# Patient Record
Sex: Male | Born: 1965 | Race: Asian | Hispanic: No | Marital: Married | Smoking: Never smoker
Health system: Southern US, Community
[De-identification: ages and names within clinical notes are randomized; demographics above are authoritative.]

## PROBLEM LIST (undated history)

## (undated) DIAGNOSIS — I10 Essential (primary) hypertension: Secondary | ICD-10-CM

## (undated) HISTORY — DX: Essential (primary) hypertension: I10

---

## 2013-07-19 ENCOUNTER — Ambulatory Visit (INDEPENDENT_AMBULATORY_CARE_PROVIDER_SITE_OTHER): Payer: BC Managed Care – PPO | Admitting: Family Medicine

## 2013-07-19 ENCOUNTER — Ambulatory Visit (INDEPENDENT_AMBULATORY_CARE_PROVIDER_SITE_OTHER): Payer: BC Managed Care – PPO | Admitting: Internal Medicine

## 2013-07-19 ENCOUNTER — Encounter: Payer: Self-pay | Admitting: Internal Medicine

## 2013-07-19 VITALS — BP 132/72 | HR 72 | Ht 65.0 in | Wt 142.0 lb

## 2013-07-19 VITALS — BP 126/86 | HR 65 | Temp 98.5°F | Resp 18 | Ht 65.0 in | Wt 143.0 lb

## 2013-07-19 DIAGNOSIS — R002 Palpitations: Secondary | ICD-10-CM

## 2013-07-19 DIAGNOSIS — R0602 Shortness of breath: Secondary | ICD-10-CM

## 2013-07-19 LAB — COMPREHENSIVE METABOLIC PANEL
ALT: 14 U/L (ref 0–53)
AST: 17 U/L (ref 0–37)
Albumin: 4.4 g/dL (ref 3.5–5.2)
Alkaline Phosphatase: 64 U/L (ref 39–117)
BUN: 14 mg/dL (ref 6–23)
CO2: 28 mEq/L (ref 19–32)
Calcium: 9.5 mg/dL (ref 8.4–10.5)
Chloride: 105 mEq/L (ref 96–112)
Creat: 1.03 mg/dL (ref 0.50–1.35)
Glucose, Bld: 102 mg/dL — ABNORMAL HIGH (ref 70–99)
Potassium: 4.5 mEq/L (ref 3.5–5.3)
Sodium: 141 mEq/L (ref 135–145)
Total Bilirubin: 0.9 mg/dL (ref 0.3–1.2)
Total Protein: 7 g/dL (ref 6.0–8.3)

## 2013-07-19 LAB — POCT CBC
Granulocyte percent: 51.3 %G (ref 37–80)
HCT, POC: 46.9 % (ref 43.5–53.7)
Hemoglobin: 14.9 g/dL (ref 14.1–18.1)
Lymph, poc: 1.9 (ref 0.6–3.4)
MCH, POC: 27.6 pg (ref 27–31.2)
MCHC: 31.8 g/dL (ref 31.8–35.4)
MCV: 86.8 fL (ref 80–97)
MID (cbc): 0.4 (ref 0–0.9)
MPV: 12 fL (ref 0–99.8)
POC Granulocyte: 2.4 (ref 2–6.9)
POC LYMPH PERCENT: 40.1 %L (ref 10–50)
POC MID %: 8.6 %M (ref 0–12)
Platelet Count, POC: 189 10*3/uL (ref 142–424)
RBC: 5.4 M/uL (ref 4.69–6.13)
RDW, POC: 15.4 %
WBC: 4.7 10*3/uL (ref 4.6–10.2)

## 2013-07-19 LAB — TSH: TSH: 3.052 u[IU]/mL (ref 0.350–4.500)

## 2013-07-19 NOTE — Progress Notes (Signed)
HPI Patient is a 48 yo who was seen earlier today by K Lauenstein.  Has noticed palpitaitons and increased fatigue  Felt uneasiness  Heart pounding.  A little tired  Cough   Normal rate but heavier Daily   Now a little chest discomfort.   Less efficient at wrk Walking/ran from parking lot did ok Occasionally sees black spots.  In past has had. Winter doesn't drinkmuch  No Known Allergies  No current outpatient prescriptions on file.   No current facility-administered medications for this visit.    History reviewed. No pertinent past medical history.  History reviewed. No pertinent past surgical history.  Family History  Problem Relation Age of Onset  . High Cholesterol Mother     History   Social History  . Marital Status: Married    Spouse Name: N/A    Number of Children: N/A  . Years of Education: N/A   Occupational History  . Not on file.   Social History Main Topics  . Smoking status: Never Smoker   . Smokeless tobacco: Not on file  . Alcohol Use: Not on file  . Drug Use: Not on file  . Sexual Activity: Not on file   Other Topics Concern  . Not on file   Social History Narrative  . No narrative on file    Review of Systems:  All systems reviewed.  They are negative to the above problem except as previously stated.  Vital Signs: BP 132/72  Pulse 72  Ht 5\' 5"  (1.651 m)  Wt 142 lb (64.411 kg)  BMI 23.63 kg/m2  Physical Exam Patient is in NAD HEENT:  Normocephalic, atraumatic. EOMI, PERRLA.  Neck: JVP is normal.  No bruits.  Lungs: clear to auscultation. No rales no wheezes.  Heart: Regular rate and rhythm. Normal S1, S2. No S3.   No significant murmurs. PMI not displaced.  Abdomen:  Supple, nontender. Normal bowel sounds. No masses. No hepatomegaly.  Extremities:   Good distal pulses throughout. No lower extremity edema.  Musculoskeletal :moving all extremities.  Neuro:   alert and oriented x3.  CN II-XII grossly intact.  EKG  SR 77 bpm.    Assessment and Plan:  Patient is a 48 yo with no prior cardiac history  Has noted chest pressure, heart beating harder and fatigue.   Not completely typical as he rushed in from parking lot without a problem I would recomm setting up for an event monitor  Also an echo. Labs were done this AM  Pending.

## 2013-07-19 NOTE — Progress Notes (Signed)
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  Wife works at [employer] Not as active in the winter Feeling more fatigue than usual  F/Hx father had ASD and died at age 48  Objective:  NAD HEENT:  Unremarkable Neck:  Supple, no thyromegaly Chest:  Clear Heart: reg with no gallop, murmur Ext: no edema.  EKG shows intermittent PVC. Spoke with Dr. Tenny Crawoss and informed her that patient is having increasing palpitations and that we did some lab tests. She agreed to see him 11:30 today.  Palpitations - Plan: EKG 12-Lead, POCT CBC, Comprehensive metabolic panel, TSH  Signed, Elvina SidleKurt Challen Spainhour, MD

## 2013-07-19 NOTE — Patient Instructions (Signed)
Your physician has requested that you have an echocardiogram. Echocardiography is a painless test that uses sound waves to create images of your heart. It provides your doctor with information about the size and shape of your heart and how well your heart's chambers and valves are working. This procedure takes approximately one hour. There are no restrictions for this procedure.  Your physician has recommended that you wear an event monitor. Event monitors are medical devices that record the heart's electrical activity. Doctors most often us these monitors to diagnose arrhythmias. Arrhythmias are problems with the speed or rhythm of the heartbeat. The monitor is a small, portable device. You can wear one while you do your normal daily activities. This is usually used to diagnose what is causing palpitations/syncope (passing out).  Dr. Tenny Crawoss will follow up with you regarding your test results.

## 2013-08-06 ENCOUNTER — Encounter: Payer: Self-pay | Admitting: Cardiovascular Disease

## 2013-08-06 ENCOUNTER — Ambulatory Visit (HOSPITAL_COMMUNITY): Payer: BC Managed Care – PPO | Attending: Cardiovascular Disease | Admitting: Radiology

## 2013-08-06 DIAGNOSIS — I079 Rheumatic tricuspid valve disease, unspecified: Secondary | ICD-10-CM | POA: Insufficient documentation

## 2013-08-06 DIAGNOSIS — R0602 Shortness of breath: Secondary | ICD-10-CM

## 2013-08-06 DIAGNOSIS — R002 Palpitations: Secondary | ICD-10-CM | POA: Insufficient documentation

## 2013-08-06 NOTE — Progress Notes (Signed)
Echocardiogram performed.  

## 2013-08-09 ENCOUNTER — Encounter: Payer: Self-pay | Admitting: Internal Medicine

## 2015-01-03 ENCOUNTER — Other Ambulatory Visit (INDEPENDENT_AMBULATORY_CARE_PROVIDER_SITE_OTHER): Payer: BC Managed Care – PPO

## 2015-01-03 ENCOUNTER — Encounter: Payer: Self-pay | Admitting: Internal Medicine

## 2015-01-03 ENCOUNTER — Ambulatory Visit (INDEPENDENT_AMBULATORY_CARE_PROVIDER_SITE_OTHER): Payer: BC Managed Care – PPO | Admitting: Internal Medicine

## 2015-01-03 VITALS — BP 120/70 | HR 67 | Temp 98.5°F | Resp 12 | Ht 64.0 in | Wt 137.8 lb

## 2015-01-03 DIAGNOSIS — M79671 Pain in right foot: Secondary | ICD-10-CM | POA: Insufficient documentation

## 2015-01-03 DIAGNOSIS — Z0000 Encounter for general adult medical examination without abnormal findings: Secondary | ICD-10-CM | POA: Diagnosis not present

## 2015-01-03 DIAGNOSIS — K921 Melena: Secondary | ICD-10-CM | POA: Insufficient documentation

## 2015-01-03 HISTORY — DX: Pain in right foot: M79.671

## 2015-01-03 HISTORY — DX: Melena: K92.1

## 2015-01-03 LAB — LIPID PANEL
Cholesterol: 194 mg/dL (ref 0–200)
HDL: 42.2 mg/dL (ref >39.00)
LDL Cholesterol: 138 mg/dL — ABNORMAL HIGH (ref 0–99)
NonHDL: 151.8
Total CHOL/HDL Ratio: 5
Triglycerides: 69 mg/dL (ref 0.0–149.0)
VLDL: 13.8 mg/dL (ref 0.0–40.0)

## 2015-01-03 LAB — COMPREHENSIVE METABOLIC PANEL
ALT: 15 U/L (ref 0–53)
AST: 19 U/L (ref 0–37)
Albumin: 4.4 g/dL (ref 3.5–5.2)
Alkaline Phosphatase: 72 U/L (ref 39–117)
BUN: 19 mg/dL (ref 6–23)
CO2: 29 mEq/L (ref 19–32)
Calcium: 9.9 mg/dL (ref 8.4–10.5)
Chloride: 103 mEq/L (ref 96–112)
Creatinine, Ser: 1.2 mg/dL (ref 0.40–1.50)
GFR: 68.26 mL/min (ref >60.00)
Glucose, Bld: 86 mg/dL (ref 70–99)
Potassium: 5 mEq/L (ref 3.5–5.1)
Sodium: 140 mEq/L (ref 135–145)
Total Bilirubin: 1.3 mg/dL — ABNORMAL HIGH (ref 0.2–1.2)
Total Protein: 7.7 g/dL (ref 6.0–8.3)

## 2015-01-03 LAB — CBC
HCT: 46.1 % (ref 39.0–52.0)
Hemoglobin: 15.4 g/dL (ref 13.0–17.0)
MCHC: 33.3 g/dL (ref 30.0–36.0)
MCV: 81.9 fl (ref 78.0–100.0)
Platelets: 202 10*3/uL (ref 150.0–400.0)
RBC: 5.63 Mil/uL (ref 4.22–5.81)
RDW: 14.6 % (ref 11.5–15.5)
WBC: 5.3 10*3/uL (ref 4.0–10.5)

## 2015-01-03 NOTE — Assessment & Plan Note (Signed)
Most likely plantar fasciitis. Does not require pain medication at this time. Given stretching exercises for rehabilitation. If worsens or does not heal will refer to sports medicine.

## 2015-01-03 NOTE — Progress Notes (Signed)
   Subjective:    Patient ID: Darius LefevreDevang Sliter, male    DOB: Nov 30, 1965, 49 y.o.   MRN: [id number]  HPI The patient is a new 49 YO man coming in for right foot pain. Was playing cricket and squash on uneven ground around the time it started about 1 month ago. Has been getting gradually better. He has not needed pain medicine for it. Has been icing it and bought some more supportive shoes which have started to help. No swelling of the ankle and no loss of function.   [facility], [facility], social history reviewed and updated at visit.   Review of Systems  Constitutional: Negative for fever, activity change, appetite change, fatigue and unexpected weight change.  HENT: Negative.   Eyes: Negative.   Respiratory: Negative for cough, chest tightness, shortness of breath and wheezing.   Cardiovascular: Negative for chest pain, palpitations and leg swelling.  Gastrointestinal: Positive for blood in stool. Negative for nausea, abdominal pain, diarrhea, constipation and abdominal distention.       Intermittent with hemorrhoids  Musculoskeletal: Positive for arthralgias. Negative for myalgias and back pain.  Skin: Negative.   Neurological: Negative.   Psychiatric/Behavioral: Negative.       Objective:   Physical Exam  Constitutional: He is oriented to person, place, and time. He appears well-developed and well-nourished.  HENT:  Head: Normocephalic and atraumatic.  Eyes: EOM are normal.  Neck: Normal range of motion.  Cardiovascular: Normal rate and regular rhythm.   Pulmonary/Chest: Effort normal and breath sounds normal. No respiratory distress. He has no wheezes. He has no rales.  Abdominal: Soft.  Musculoskeletal:  Some pain around the heel and bottom of the foot  Neurological: He is alert and oriented to person, place, and time.  Skin: Skin is warm and dry.  Psychiatric: He has a normal mood and affect.   Filed Vitals:   01/03/15 0915  BP: 120/70  Pulse: 67  Temp: 98.5 F (36.9 C)  TempSrc:  Oral  Resp: 12  Height: 5\' 4"  (1.626 m)  Weight: 137 lb 12.8 oz (62.506 kg)  SpO2: 98%      Assessment & Plan:

## 2015-01-03 NOTE — Assessment & Plan Note (Signed)
Intermittent and not present now. Maybe once per year with hard stools and straining. Talked to him about the importance of getting his screening colonoscopy at 50 and checking CBC today.

## 2015-01-03 NOTE — Progress Notes (Signed)
Pre visit review using our clinic review tool, if applicable. No additional management support is needed unless otherwise documented below in the visit note. 

## 2015-01-03 NOTE — Patient Instructions (Signed)
We will check the blood work today and call you back with the results.   The pain in the foot should continue to get better and I will give you some stretches to do to help it to feel better.   Come back in about 1 year and when you turn 50 we can do the colon cancer screening.   Plantar Fasciitis (Heel Spur Syndrome) with Rehab The plantar fascia is a fibrous, ligament-like, soft-tissue structure that spans the bottom of the foot. Plantar fasciitis is a condition that causes pain in the foot due to inflammation of the tissue. SYMPTOMS   Pain and tenderness on the underneath side of the foot.  Pain that worsens with standing or walking. CAUSES  Plantar fasciitis is caused by irritation and injury to the plantar fascia on the underneath side of the foot. Common mechanisms of injury include:  Direct trauma to bottom of the foot.  Damage to a small nerve that runs under the foot where the main fascia attaches to the heel bone.  Stress placed on the plantar fascia due to bone spurs. RISK INCREASES WITH:   Activities that place stress on the plantar fascia (running, jumping, pivoting, or cutting).  Poor strength and flexibility.  Improperly fitted shoes.  Tight calf muscles.  Flat feet.  Failure to warm-up properly before activity.  Obesity. PREVENTION  Warm up and stretch properly before activity.  Allow for adequate recovery between workouts.  Maintain physical fitness:  Strength, flexibility, and endurance.  Cardiovascular fitness.  Maintain a health body weight.  Avoid stress on the plantar fascia.  Wear properly fitted shoes, including arch supports for individuals who have flat feet. PROGNOSIS  If treated properly, then the symptoms of plantar fasciitis usually resolve without surgery. However, occasionally surgery is necessary. RELATED COMPLICATIONS   Recurrent symptoms that may result in a chronic condition.  Problems of the lower back that are caused by  compensating for the injury, such as limping.  Pain or weakness of the foot during push-off following surgery.  Chronic inflammation, scarring, and partial or complete fascia tear, occurring more often from repeated injections. TREATMENT  Treatment initially involves the use of ice and medication to help reduce pain and inflammation. The use of strengthening and stretching exercises may help reduce pain with activity, especially stretches of the Achilles tendon. These exercises may be performed at home or with a therapist. Your caregiver may recommend that you use heel cups of arch supports to help reduce stress on the plantar fascia. Occasionally, corticosteroid injections are given to reduce inflammation. If symptoms persist for greater than 6 months despite non-surgical (conservative), then surgery may be recommended.  MEDICATION   If pain medication is necessary, then nonsteroidal anti-inflammatory medications, such as aspirin and ibuprofen, or other minor pain relievers, such as acetaminophen, are often recommended.  Do not take pain medication within 7 days before surgery.  Prescription pain relievers may be given if deemed necessary by your caregiver. Use only as directed and only as much as you need.  Corticosteroid injections may be given by your caregiver. These injections should be reserved for the most serious cases, because they may only be given a certain number of times. HEAT AND COLD  Cold treatment (icing) relieves pain and reduces inflammation. Cold treatment should be applied for 10 to 15 minutes every 2 to 3 hours for inflammation and pain and immediately after any activity that aggravates your symptoms. Use ice packs or massage the area with a piece  of ice (ice massage).  Heat treatment may be used prior to performing the stretching and strengthening activities prescribed by your caregiver, physical therapist, or athletic trainer. Use a heat pack or soak the injury in warm  water. SEEK IMMEDIATE MEDICAL CARE IF:  Treatment seems to offer no benefit, or the condition worsens.  Any medications produce adverse side effects. EXERCISES RANGE OF MOTION (ROM) AND STRETCHING EXERCISES - Plantar Fasciitis (Heel Spur Syndrome) These exercises may help you when beginning to rehabilitate your injury. Your symptoms may resolve with or without further involvement from your physician, physical therapist or athletic trainer. While completing these exercises, remember:   Restoring tissue flexibility helps normal motion to return to the joints. This allows healthier, less painful movement and activity.  An effective stretch should be held for at least 30 seconds.  A stretch should never be painful. You should only feel a gentle lengthening or release in the stretched tissue. RANGE OF MOTION - Toe Extension, Flexion  Sit with your right / left leg crossed over your opposite knee.  Grasp your toes and gently pull them back toward the top of your foot. You should feel a stretch on the bottom of your toes and/or foot.  Hold this stretch for __________ seconds.  Now, gently pull your toes toward the bottom of your foot. You should feel a stretch on the top of your toes and or foot.  Hold this stretch for __________ seconds. Repeat __________ times. Complete this stretch __________ times per day.  RANGE OF MOTION - Ankle Dorsiflexion, Active Assisted  Remove shoes and sit on a chair that is preferably not on a carpeted surface.  Place right / left foot under knee. Extend your opposite leg for support.  Keeping your heel down, slide your right / left foot back toward the chair until you feel a stretch at your ankle or calf. If you do not feel a stretch, slide your bottom forward to the edge of the chair, while still keeping your heel down.  Hold this stretch for __________ seconds. Repeat __________ times. Complete this stretch __________ times per day.  STRETCH - Gastroc,  Standing  Place hands on wall.  Extend right / left leg, keeping the front knee somewhat bent.  Slightly point your toes inward on your back foot.  Keeping your right / left heel on the floor and your knee straight, shift your weight toward the wall, not allowing your back to arch.  You should feel a gentle stretch in the right / left calf. Hold this position for __________ seconds. Repeat __________ times. Complete this stretch __________ times per day. STRETCH - Soleus, Standing  Place hands on wall.  Extend right / left leg, keeping the other knee somewhat bent.  Slightly point your toes inward on your back foot.  Keep your right / left heel on the floor, bend your back knee, and slightly shift your weight over the back leg so that you feel a gentle stretch deep in your back calf.  Hold this position for __________ seconds. Repeat __________ times. Complete this stretch __________ times per day. STRETCH - Gastrocsoleus, Standing  Note: This exercise can place a lot of stress on your foot and ankle. Please complete this exercise only if specifically instructed by your caregiver.   Place the ball of your right / left foot on a step, keeping your other foot firmly on the same step.  Hold on to the wall or a rail for balance.  Slowly lift your other foot, allowing your body weight to press your heel down over the edge of the step.  You should feel a stretch in your right / left calf.  Hold this position for __________ seconds.  Repeat this exercise with a slight bend in your right / left knee. Repeat __________ times. Complete this stretch __________ times per day.  STRENGTHENING EXERCISES - Plantar Fasciitis (Heel Spur Syndrome)  These exercises may help you when beginning to rehabilitate your injury. They may resolve your symptoms with or without further involvement from your physician, physical therapist or athletic trainer. While completing these exercises, remember:    Muscles can gain both the endurance and the strength needed for everyday activities through controlled exercises.  Complete these exercises as instructed by your physician, physical therapist or athletic trainer. Progress the resistance and repetitions only as guided. STRENGTH - Towel Curls  Sit in a chair positioned on a non-carpeted surface.  Place your foot on a towel, keeping your heel on the floor.  Pull the towel toward your heel by only curling your toes. Keep your heel on the floor.  If instructed by your physician, physical therapist or athletic trainer, add ____________________ at the end of the towel. Repeat __________ times. Complete this exercise __________ times per day. STRENGTH - Ankle Inversion  Secure one end of a rubber exercise band/tubing to a fixed object (table, pole). Loop the other end around your foot just before your toes.  Place your fists between your knees. This will focus your strengthening at your ankle.  Slowly, pull your big toe up and in, making sure the band/tubing is positioned to resist the entire motion.  Hold this position for __________ seconds.  Have your muscles resist the band/tubing as it slowly pulls your foot back to the starting position. Repeat __________ times. Complete this exercises __________ times per day.  Document Released: 06/17/2005 Document Revised: 09/09/2011 Document Reviewed: 09/29/2008 [facility] Patient Information 2015 [place], [place]. This information is not intended to replace advice given to you by your health care provider. Make sure you discuss any questions you have with your health care provider.

## 2015-01-04 ENCOUNTER — Encounter: Payer: Self-pay | Admitting: Internal Medicine

## 2016-02-06 ENCOUNTER — Ambulatory Visit (INDEPENDENT_AMBULATORY_CARE_PROVIDER_SITE_OTHER): Payer: BC Managed Care – PPO | Admitting: Family

## 2016-02-06 ENCOUNTER — Encounter: Payer: Self-pay | Admitting: Family

## 2016-02-06 DIAGNOSIS — J069 Acute upper respiratory infection, unspecified: Secondary | ICD-10-CM

## 2016-02-06 HISTORY — DX: Acute upper respiratory infection, unspecified: J06.9

## 2016-02-06 NOTE — Assessment & Plan Note (Signed)
Symptoms and exam consistent with acute upper respiratory infection most likely viral with continued improvement. Recommend over-the-counter medications as needed for symptom relief and supportive care. Consider additional therapy if symptoms worsen or do not improve.

## 2016-02-06 NOTE — Patient Instructions (Signed)
Thank you for choosing [facility].  Summary/Instructions:  Your prescription(s) have been submitted to your pharmacy or been printed and provided for you. Please take as directed and contact our office if you believe you are having problem(s) with the medication(s) or have any questions.  If your symptoms worsen or fail to improve, please contact our office for further instruction, or in case of emergency go directly to the emergency room at the closest medical facility.    Upper Respiratory Infection, Adult Most upper respiratory infections (URIs) are a viral infection of the air passages leading to the lungs. A URI affects the nose, throat, and upper air passages. The most common type of URI is nasopharyngitis and is typically referred to as "the common cold." URIs run their course and usually go away on their own. Most of the time, a URI does not require medical attention, but sometimes a bacterial infection in the upper airways can follow a viral infection. This is called a secondary infection. Sinus and middle ear infections are common types of secondary upper respiratory infections. Bacterial pneumonia can also complicate a URI. A URI can worsen asthma and chronic obstructive pulmonary disease (COPD). Sometimes, these complications can require emergency medical care and may be life threatening.  CAUSES Almost all URIs are caused by viruses. A virus is a type of germ and can spread from one person to another.  RISKS FACTORS You may be at risk for a URI if:   You smoke.   You have chronic heart or lung disease.  You have a weakened defense (immune) system.   You are very young or very old.   You have nasal allergies or asthma.  You work in crowded or poorly ventilated areas.  You work in health care facilities or schools. SIGNS AND SYMPTOMS  Symptoms typically develop 2-3 days after you come in contact with a cold virus. Most viral URIs last 7-10 days. However, viral  URIs from the influenza virus (flu virus) can last 14-18 days and are typically more severe. Symptoms may include:   Runny or stuffy (congested) nose.   Sneezing.   Cough.   Sore throat.   Headache.   Fatigue.   Fever.   Loss of appetite.   Pain in your forehead, behind your eyes, and over your cheekbones (sinus pain).  Muscle aches.  DIAGNOSIS  Your health care provider may diagnose a URI by:  Physical exam.  Tests to check that your symptoms are not due to another condition such as:  Strep throat.  Sinusitis.  Pneumonia.  Asthma. TREATMENT  A URI goes away on its own with time. It cannot be cured with medicines, but medicines may be prescribed or recommended to relieve symptoms. Medicines may help:  Reduce your fever.  Reduce your cough.  Relieve nasal congestion. HOME CARE INSTRUCTIONS   Take medicines only as directed by your health care provider.   Gargle warm saltwater or take cough drops to comfort your throat as directed by your health care provider.  Use a warm mist humidifier or inhale steam from a shower to increase air moisture. This may make it easier to breathe.  Drink enough fluid to keep your urine clear or pale yellow.   Eat soups and other clear broths and maintain good nutrition.   Rest as needed.   Return to work when your temperature has returned to normal or as your health care provider advises. You may need to stay home longer to avoid infecting others.   You can also use a face mask and careful hand washing to prevent spread of the virus.  Increase the usage of your inhaler if you have asthma.   Do not use any tobacco products, including cigarettes, chewing tobacco, or electronic cigarettes. If you need help quitting, ask your health care provider. PREVENTION  The best way to protect yourself from getting a cold is to practice good hygiene.   Avoid oral or hand contact with people with cold symptoms.   Wash your  hands often if contact occurs.  There is no clear evidence that vitamin C, vitamin E, echinacea, or exercise reduces the chance of developing a cold. However, it is always recommended to get plenty of rest, exercise, and practice good nutrition.  SEEK MEDICAL CARE IF:   You are getting worse rather than better.   Your symptoms are not controlled by medicine.   You have chills.  You have worsening shortness of breath.  You have brown or red mucus.  You have yellow or brown nasal discharge.  You have pain in your face, especially when you bend forward.  You have a fever.  You have swollen neck glands.  You have pain while swallowing.  You have white areas in the back of your throat. SEEK IMMEDIATE MEDICAL CARE IF:   You have severe or persistent:  Headache.  Ear pain.  Sinus pain.  Chest pain.  You have chronic lung disease and any of the following:  Wheezing.  Prolonged cough.  Coughing up blood.  A change in your usual mucus.  You have a stiff neck.  You have changes in your:  Vision.  Hearing.  Thinking.  Mood. MAKE SURE YOU:   Understand these instructions.  Will watch your condition.  Will get help right away if you are not doing well or get worse.   This information is not intended to replace advice given to you by your health care provider. Make sure you discuss any questions you have with your health care provider.   Document Released: 12/11/2000 Document Revised: 11/01/2014 Document Reviewed: 09/22/2013 [employer] Interactive Patient Education 2016 [employer]  

## 2016-02-06 NOTE — Progress Notes (Signed)
   Subjective:    Patient ID: Darius Velasquez, male    DOB: [birth year], 50 y.o.   MRN: [id number]  Chief Complaint  Patient presents with  . Cough    sore throat, cough, and  weakness x17 days, feels better but not going away    HPI:  Darius Velasquez is a 50 y.o. male who  has no past medical history on file. and presents today for an acute office visit.   This is a new problem. Associated symptom of sore throat, cough and weakness has been going on for about 2 weeks. There has been improvement since the initial onset of symptoms but it is not fully gone. No fevers. Modifying factors include cold/flu and cough drops which help with his symptoms. Aggravating factors include physical activity and talking. Denies any recent antibiotics. No stomach pains, nausea, vomiting, or diarreha. No recent antibiotics noted. No sick contacts.    No Known Allergies   No current outpatient prescriptions on file prior to visit.   No current facility-administered medications on file prior to visit.     Review of Systems  Constitutional: Negative for chills and fever.  HENT: Positive for congestion. Negative for sinus pressure and sore throat.   Respiratory: Positive for cough. Negative for chest tightness and shortness of breath.   Neurological: Positive for weakness.      Objective:    BP (!) 148/86 (BP Location: Left Arm, Patient Position: Sitting, Cuff Size: Normal)   Pulse 75   Temp 98.2 F (36.8 C) (Oral)   Resp 16   Ht 5\' 4"  (1.626 m)   Wt 141 lb (64 kg)   SpO2 97%   BMI 24.20 kg/m  Nursing note and vital signs reviewed.  Physical Exam  Constitutional: He is oriented to person, place, and time. He appears well-developed and well-nourished. No distress.  HENT:  Right Ear: Hearing, tympanic membrane, external ear and ear canal normal.  Left Ear: Hearing, tympanic membrane, external ear and ear canal normal.  Nose: Nose normal. Right sinus exhibits no maxillary sinus tenderness and no  frontal sinus tenderness. Left sinus exhibits no maxillary sinus tenderness and no frontal sinus tenderness.  Mouth/Throat: Uvula is midline, oropharynx is clear and moist and mucous membranes are normal.  Cardiovascular: Normal rate, regular rhythm, normal heart sounds and intact distal pulses.   Pulmonary/Chest: Effort normal and breath sounds normal. No respiratory distress. He has no wheezes. He has no rales. He exhibits no tenderness.  Lymphadenopathy:    He has no cervical adenopathy.  Neurological: He is alert and oriented to person, place, and time.  Skin: Skin is warm and dry.  Psychiatric: He has a normal mood and affect. His behavior is normal. Judgment and thought content normal.       Assessment & Plan:   Problem List Items Addressed This Visit      Respiratory   Acute upper respiratory infection    Symptoms and exam consistent with acute upper respiratory infection most likely viral with continued improvement. Recommend over-the-counter medications as needed for symptom relief and supportive care. Consider additional therapy if symptoms worsen or do not improve.       Other Visit Diagnoses   None.     No orders of the defined types were placed in this encounter.    Follow-up: Return if symptoms worsen or fail to improve.  Jeanine Luzalone, Gregory, FNP

## 2016-10-23 MED FILL — AMOXICILLIN 500 MG CAPSULE: 500 | 7 days supply | Qty: 28 | Fill #0

## 2016-11-08 ENCOUNTER — Encounter: Payer: BC Managed Care – PPO | Admitting: Internal Medicine

## 2016-11-08 ENCOUNTER — Other Ambulatory Visit (INDEPENDENT_AMBULATORY_CARE_PROVIDER_SITE_OTHER): Payer: BC Managed Care – PPO

## 2016-11-08 ENCOUNTER — Ambulatory Visit (INDEPENDENT_AMBULATORY_CARE_PROVIDER_SITE_OTHER): Payer: BC Managed Care – PPO | Admitting: Internal Medicine

## 2016-11-08 ENCOUNTER — Encounter: Payer: Self-pay | Admitting: Internal Medicine

## 2016-11-08 VITALS — BP 138/82 | HR 86 | Temp 98.7°F | Resp 12 | Ht 64.0 in | Wt 143.0 lb

## 2016-11-08 DIAGNOSIS — R7989 Other specified abnormal findings of blood chemistry: Secondary | ICD-10-CM | POA: Diagnosis not present

## 2016-11-08 DIAGNOSIS — Z0000 Encounter for general adult medical examination without abnormal findings: Secondary | ICD-10-CM

## 2016-11-08 LAB — CBC
HCT: 44.2 % (ref 39.0–52.0)
Hemoglobin: 14.7 g/dL (ref 13.0–17.0)
MCHC: 33.2 g/dL (ref 30.0–36.0)
MCV: 82.7 fl (ref 78.0–100.0)
Platelets: 191 10*3/uL (ref 150.0–400.0)
RBC: 5.35 Mil/uL (ref 4.22–5.81)
RDW: 14.4 % (ref 11.5–15.5)
WBC: 5.6 10*3/uL (ref 4.0–10.5)

## 2016-11-08 LAB — COMPREHENSIVE METABOLIC PANEL WITH GFR
ALT: 16 U/L (ref 0–53)
AST: 15 U/L (ref 0–37)
Albumin: 4.4 g/dL (ref 3.5–5.2)
Alkaline Phosphatase: 64 U/L (ref 39–117)
BUN: 17 mg/dL (ref 6–23)
CO2: 30 meq/L (ref 19–32)
Calcium: 9.6 mg/dL (ref 8.4–10.5)
Chloride: 105 meq/L (ref 96–112)
Creatinine, Ser: 1.1 mg/dL (ref 0.40–1.50)
GFR: 74.92 mL/min
Glucose, Bld: 103 mg/dL — ABNORMAL HIGH (ref 70–99)
Potassium: 4.4 meq/L (ref 3.5–5.1)
Sodium: 141 meq/L (ref 135–145)
Total Bilirubin: 0.9 mg/dL (ref 0.2–1.2)
Total Protein: 7.1 g/dL (ref 6.0–8.3)

## 2016-11-08 LAB — LIPID PANEL
Cholesterol: 199 mg/dL (ref 0–200)
HDL: 42.2 mg/dL
NonHDL: 157.26
Total CHOL/HDL Ratio: 5
Triglycerides: 224 mg/dL — ABNORMAL HIGH (ref 0.0–149.0)
VLDL: 44.8 mg/dL — ABNORMAL HIGH (ref 0.0–40.0)

## 2016-11-08 LAB — LDL CHOLESTEROL, DIRECT: Direct LDL: 133 mg/dL

## 2016-11-08 LAB — HEMOGLOBIN A1C: Hgb A1c MFr Bld: 5.9 % (ref 4.6–6.5)

## 2016-11-08 NOTE — Assessment & Plan Note (Signed)
Offered tetanus and shingrix but he declines today. Counseled on screening colonoscopy and he will think about it. Checking labs but declines need for hiv screening. Exercises regularly and non-smoker. Given screening recommendations.

## 2016-11-08 NOTE — Progress Notes (Signed)
   Subjective:    Patient ID: Jacalyn LefevreDevang Costilla, male    DOB: April 28, 1966, 51 y.o.   MRN: [id number]  HPI The patient is a 51 YO man coming in for wellness. No new concerns.   [facility], [facility] reviewed and updated.   Review of Systems  Constitutional: Negative.   HENT: Negative.   Eyes: Negative.   Respiratory: Negative for cough, chest tightness and shortness of breath.   Cardiovascular: Negative for chest pain, palpitations and leg swelling.  Gastrointestinal: Negative for abdominal distention, abdominal pain, constipation, diarrhea, nausea and vomiting.  Musculoskeletal: Negative.   Skin: Negative.   Neurological: Negative.   Psychiatric/Behavioral: Negative.       Objective:   Physical Exam  Constitutional: He is oriented to person, place, and time. He appears well-developed and well-nourished.  HENT:  Head: Normocephalic and atraumatic.  Eyes: EOM are normal.  Neck: Normal range of motion.  Cardiovascular: Normal rate and regular rhythm.   Pulmonary/Chest: Effort normal and breath sounds normal. No respiratory distress. He has no wheezes. He has no rales.  Abdominal: Soft. Bowel sounds are normal. He exhibits no distension. There is no tenderness. There is no rebound.  Musculoskeletal: He exhibits no edema.  Neurological: He is alert and oriented to person, place, and time. Coordination normal.  Skin: Skin is warm and dry.  Psychiatric: He has a normal mood and affect.   Vitals:   11/08/16 1338  BP: 138/82  Pulse: 86  Resp: 12  Temp: 98.7 F (37.1 C)  TempSrc: Oral  SpO2: 99%  Weight: 143 lb (64.9 kg)  Height: 5\' 4"  (1.626 m)      Assessment & Plan:

## 2016-11-08 NOTE — Patient Instructions (Signed)
Think about the colonoscopy and the shingles shot and tetanus.    Colonoscopy, Adult A colonoscopy is an exam to look at the entire large intestine. During the exam, a lubricated, bendable tube is inserted into the anus and then passed into the rectum, colon, and other parts of the large intestine. A colonoscopy is often done as a part of normal colorectal screening or in response to certain symptoms, such as anemia, persistent diarrhea, abdominal pain, and blood in the stool. The exam can help screen for and diagnose medical problems, including:  Tumors.  Polyps.  Inflammation.  Areas of bleeding. Tell a health care provider about:  Any allergies you have.  All medicines you are taking, including vitamins, herbs, eye drops, creams, and over-the-counter medicines.  Any problems you or family members have had with anesthetic medicines.  Any blood disorders you have.  Any surgeries you have had.  Any medical conditions you have.  Any problems you have had passing stool. What are the risks? Generally, this is a safe procedure. However, problems may occur, including:  Bleeding.  A tear in the intestine.  A reaction to medicines given during the exam.  Infection (rare). What happens before the procedure? Eating and drinking restrictions  Follow instructions from your health care provider about eating and drinking, which may include:  A few days before the procedure - follow a low-fiber diet. Avoid nuts, seeds, dried fruit, raw fruits, and vegetables.  1-3 days before the procedure - follow a clear liquid diet. Drink only clear liquids, such as clear broth or bouillon, black coffee or tea, clear juice, clear soft drinks or sports drinks, gelatin dessert, and popsicles. Avoid any liquids that contain red or purple dye.  On the day of the procedure - do not eat or drink anything during the 2 hours before the procedure, or within the time period that your health care provider  recommends. Bowel prep  If you were prescribed an oral bowel prep to clean out your colon:  Take it as told by your health care provider. Starting the day before your procedure, you will need to drink a large amount of medicated liquid. The liquid will cause you to have multiple loose stools until your stool is almost clear or light green.  If your skin or anus gets irritated from diarrhea, you may use these to relieve the irritation:  Medicated wipes, such as adult wet wipes with aloe and vitamin E.  A skin soothing-product like petroleum jelly.  If you vomit while drinking the bowel prep, take a break for up to 60 minutes and then begin the bowel prep again. If vomiting continues and you cannot take the bowel prep without vomiting, call your health care provider. General instructions   Ask your health care provider about changing or stopping your regular medicines. This is especially important if you are taking diabetes medicines or blood thinners.  Plan to have someone take you home from the hospital or clinic. What happens during the procedure?  An IV tube may be inserted into one of your veins.  You will be given medicine to help you relax (sedative).  To reduce your risk of infection:  Your health care team will wash or sanitize their hands.  Your anal area will be washed with soap.  You will be asked to lie on your side with your knees bent.  Your health care provider will lubricate a long, thin, flexible tube. The tube will have a camera and a  light on the end.  The tube will be inserted into your anus.  The tube will be gently eased through your rectum and colon.  Air will be delivered into your colon to keep it open. You may feel some pressure or cramping.  The camera will be used to take images during the procedure.  A small tissue sample may be removed from your body to be examined under a microscope (biopsy). If any potential problems are found, the tissue will  be sent to a lab for testing.  If small polyps are found, your health care provider may remove them and have them checked for cancer cells.  The tube that was inserted into your anus will be slowly removed. The procedure may vary among health care providers and hospitals. What happens after the procedure?  Your blood pressure, heart rate, breathing rate, and blood oxygen level will be monitored until the medicines you were given have worn off.  Do not drive for 24 hours after the exam.  You may have a small amount of blood in your stool.  You may pass gas and have mild abdominal cramping or bloating due to the air that was used to inflate your colon during the exam.  It is up to you to get the results of your procedure. Ask your health care provider, or the department performing the procedure, when your results will be ready. This information is not intended to replace advice given to you by your health care provider. Make sure you discuss any questions you have with your health care provider. Document Released: 06/14/2000 Document Revised: 04/17/2016 Document Reviewed: 08/29/2015 Elsevier Interactive Patient Education  2017 [employer]    Health Maintenance, Male A healthy lifestyle and preventive care is important for your health and wellness. Ask your health care provider about what schedule of regular examinations is right for you. What should I know about weight and diet?  Eat a Healthy Diet  Eat plenty of vegetables, fruits, whole grains, low-fat dairy products, and lean protein.  Do not eat a lot of foods high in solid fats, added sugars, or salt. Maintain a Healthy Weight  Regular exercise can help you achieve or maintain a healthy weight. You should:  Do at least 150 minutes of exercise each week. The exercise should increase your heart rate and make you sweat (moderate-intensity exercise).  Do strength-training exercises at least twice a week. Watch Your Levels of  Cholesterol and Blood Lipids  Have your blood tested for lipids and cholesterol every 5 years starting at 51 years of age. If you are at high risk for heart disease, you should start having your blood tested when you are 51 years old. You may need to have your cholesterol levels checked more often if:  Your lipid or cholesterol levels are high.  You are older than 51 years of age.  You are at high risk for heart disease. What should I know about cancer screening? Many types of cancers can be detected early and may often be prevented. Lung Cancer  You should be screened every year for lung cancer if:  You are a current smoker who has smoked for at least 30 years.  You are a former smoker who has quit within the past 15 years.  Talk to your health care provider about your screening options, when you should start screening, and how often you should be screened. Colorectal Cancer  Routine colorectal cancer screening usually begins at 51 years of age and  should be repeated every 5-10 years until you are 51 years old. You may need to be screened more often if early forms of precancerous polyps or small growths are found. Your health care provider may recommend screening at an earlier age if you have risk factors for colon cancer.  Your health care provider may recommend using home test kits to check for hidden blood in the stool.  A small camera at the end of a tube can be used to examine your colon (sigmoidoscopy or colonoscopy). This checks for the earliest forms of colorectal cancer. Prostate and Testicular Cancer  Depending on your age and overall health, your health care provider may do certain tests to screen for prostate and testicular cancer.  Talk to your health care provider about any symptoms or concerns you have about testicular or prostate cancer. Skin Cancer  Check your skin from head to toe regularly.  Tell your health care provider about any new moles or changes in  moles, especially if:  There is a change in a mole's size, shape, or color.  You have a mole that is larger than a pencil eraser.  Always use sunscreen. Apply sunscreen liberally and repeat throughout the day.  Protect yourself by wearing long sleeves, pants, a wide-brimmed hat, and sunglasses when outside. What should I know about heart disease, diabetes, and high blood pressure?  If you are 83-81 years of age, have your blood pressure checked every 3-5 years. If you are 70 years of age or older, have your blood pressure checked every year. You should have your blood pressure measured twice-once when you are at a hospital or clinic, and once when you are not at a hospital or clinic. Record the average of the two measurements. To check your blood pressure when you are not at a hospital or clinic, you can use:  An automated blood pressure machine at a pharmacy.  A home blood pressure monitor.  Talk to your health care provider about your target blood pressure.  If you are between 47-15 years old, ask your health care provider if you should take aspirin to prevent heart disease.  Have regular diabetes screenings by checking your fasting blood sugar level.  If you are at a normal weight and have a low risk for diabetes, have this test once every three years after the age of 62.  If you are overweight and have a high risk for diabetes, consider being tested at a younger age or more often.  A one-time screening for abdominal aortic aneurysm (AAA) by ultrasound is recommended for men aged 65-75 years who are current or former smokers. What should I know about preventing infection? Hepatitis B  If you have a higher risk for hepatitis B, you should be screened for this virus. Talk with your health care provider to find out if you are at risk for hepatitis B infection. Hepatitis C  Blood testing is recommended for:  Everyone born from [birth year] through 1965.  Anyone with known risk factors for  hepatitis C. Sexually Transmitted Diseases (STDs)  You should be screened each year for STDs including gonorrhea and chlamydia if:  You are sexually active and are younger than 51 years of age.  You are older than 51 years of age and your health care provider tells you that you are at risk for this type of infection.  Your sexual activity has changed since you were last screened and you are at an increased risk for chlamydia or gonorrhea.  Ask your health care provider if you are at risk.  Talk with your health care provider about whether you are at high risk of being infected with HIV. Your health care provider may recommend a prescription medicine to help prevent HIV infection. What else can I do?  Schedule regular health, dental, and eye exams.  Stay current with your vaccines (immunizations).  Do not use any tobacco products, such as cigarettes, chewing tobacco, and e-cigarettes. If you need help quitting, ask your health care provider.  Limit alcohol intake to no more than 2 drinks per day. One drink equals 12 ounces of beer, 5 ounces of wine, or 1 ounces of hard liquor.  Do not use street drugs.  Do not share needles.  Ask your health care provider for help if you need support or information about quitting drugs.  Tell your health care provider if you often feel depressed.  Tell your health care provider if you have ever been abused or do not feel safe at home. This information is not intended to replace advice given to you by your health care provider. Make sure you discuss any questions you have with your health care provider. Document Released: 12/14/2007 Document Revised: 02/14/2016 Document Reviewed: 03/21/2015 Elsevier Interactive Patient Education  2017 [employer].

## 2016-11-11 ENCOUNTER — Encounter: Payer: Self-pay | Admitting: Internal Medicine

## 2016-11-12 ENCOUNTER — Encounter: Payer: Self-pay | Admitting: Internal Medicine

## 2016-11-19 ENCOUNTER — Encounter: Payer: Self-pay | Admitting: Internal Medicine

## 2016-11-21 ENCOUNTER — Other Ambulatory Visit: Payer: Self-pay | Admitting: Gastroenterology

## 2016-11-21 DIAGNOSIS — K6289 Other specified diseases of anus and rectum: Secondary | ICD-10-CM

## 2016-11-21 DIAGNOSIS — K625 Hemorrhage of anus and rectum: Secondary | ICD-10-CM

## 2016-11-27 ENCOUNTER — Encounter: Payer: Self-pay | Admitting: Internal Medicine

## 2016-11-27 LAB — HM COLONOSCOPY

## 2016-11-27 NOTE — Progress Notes (Unsigned)
Results entered and sent to scan  

## 2016-12-01 ENCOUNTER — Ambulatory Visit
Admission: RE | Admit: 2016-12-01 | Discharge: 2016-12-01 | Disposition: A | Discharge status: Home / Self Care | Payer: BC Managed Care – PPO | Source: Ambulatory Visit | Attending: Gastroenterology | Admitting: Gastroenterology

## 2016-12-01 ENCOUNTER — Other Ambulatory Visit: Payer: Self-pay | Admitting: Gastroenterology

## 2016-12-01 DIAGNOSIS — K6289 Other specified diseases of anus and rectum: Secondary | ICD-10-CM

## 2016-12-01 DIAGNOSIS — K625 Hemorrhage of anus and rectum: Secondary | ICD-10-CM

## 2016-12-09 ENCOUNTER — Other Ambulatory Visit: Payer: Self-pay | Admitting: Gastroenterology

## 2016-12-09 DIAGNOSIS — K623 Rectal prolapse: Secondary | ICD-10-CM

## 2018-02-24 ENCOUNTER — Encounter: Payer: BC Managed Care – PPO | Admitting: Internal Medicine

## 2018-02-24 DIAGNOSIS — Z0289 Encounter for other administrative examinations: Secondary | ICD-10-CM

## 2018-02-25 ENCOUNTER — Ambulatory Visit (INDEPENDENT_AMBULATORY_CARE_PROVIDER_SITE_OTHER): Payer: BC Managed Care – PPO | Admitting: Internal Medicine

## 2018-02-25 ENCOUNTER — Other Ambulatory Visit (INDEPENDENT_AMBULATORY_CARE_PROVIDER_SITE_OTHER): Payer: BC Managed Care – PPO

## 2018-02-25 ENCOUNTER — Encounter: Payer: Self-pay | Admitting: Internal Medicine

## 2018-02-25 ENCOUNTER — Ambulatory Visit (INDEPENDENT_AMBULATORY_CARE_PROVIDER_SITE_OTHER)
Admission: RE | Admit: 2018-02-25 | Discharge: 2018-02-25 | Disposition: A | Discharge status: Home / Self Care | Payer: BC Managed Care – PPO | Source: Ambulatory Visit | Attending: Internal Medicine | Admitting: Internal Medicine

## 2018-02-25 VITALS — BP 140/90 | HR 70 | Temp 98.3°F | Ht 64.0 in | Wt 144.0 lb

## 2018-02-25 DIAGNOSIS — Z0000 Encounter for general adult medical examination without abnormal findings: Secondary | ICD-10-CM

## 2018-02-25 LAB — CBC
HCT: 45.5 % (ref 39.0–52.0)
Hemoglobin: 15.1 g/dL (ref 13.0–17.0)
MCHC: 33.3 g/dL (ref 30.0–36.0)
MCV: 82.5 fl (ref 78.0–100.0)
Platelets: 180 10*3/uL (ref 150.0–400.0)
RBC: 5.52 Mil/uL (ref 4.22–5.81)
RDW: 14.7 % (ref 11.5–15.5)
WBC: 4.2 10*3/uL (ref 4.0–10.5)

## 2018-02-25 LAB — COMPREHENSIVE METABOLIC PANEL
ALT: 15 U/L (ref 0–53)
AST: 15 U/L (ref 0–37)
Albumin: 4.4 g/dL (ref 3.5–5.2)
Alkaline Phosphatase: 71 U/L (ref 39–117)
BUN: 10 mg/dL (ref 6–23)
CO2: 27 mEq/L (ref 19–32)
Calcium: 9.7 mg/dL (ref 8.4–10.5)
Chloride: 104 mEq/L (ref 96–112)
Creatinine, Ser: 1.19 mg/dL (ref 0.40–1.50)
GFR: 68.07 mL/min (ref >60.00)
Glucose, Bld: 107 mg/dL — ABNORMAL HIGH (ref 70–99)
Potassium: 4.3 mEq/L (ref 3.5–5.1)
Sodium: 139 mEq/L (ref 135–145)
Total Bilirubin: 1.1 mg/dL (ref 0.2–1.2)
Total Protein: 7.4 g/dL (ref 6.0–8.3)

## 2018-02-25 LAB — LIPID PANEL
Cholesterol: 206 mg/dL — ABNORMAL HIGH (ref 0–200)
HDL: 46.1 mg/dL (ref >39.00)
LDL Cholesterol: 144 mg/dL — ABNORMAL HIGH (ref 0–99)
NonHDL: 159.97
Total CHOL/HDL Ratio: 4
Triglycerides: 79 mg/dL (ref 0.0–149.0)
VLDL: 15.8 mg/dL (ref 0.0–40.0)

## 2018-02-25 NOTE — Patient Instructions (Signed)

## 2018-02-25 NOTE — Assessment & Plan Note (Signed)
Colonoscopy up to date. Declines flu. Declines tetanus. Declines shingrix. Counseled about sun safety and mole surveillance as well as dangers of distracted driving. Declines HIV screening. Given screening recommendations.

## 2018-02-25 NOTE — Progress Notes (Signed)
   Subjective:    Patient ID: Darius Velasquez, male    DOB: 07/30/1965, 52 y.o.   MRN: [id number]  HPI The patient is a 52 YO man coming in for physical.   [facility], [facility], social history reviewed and updated.   Review of Systems  Constitutional: Negative.   HENT: Negative.   Eyes: Negative.   Respiratory: Negative for cough, chest tightness and shortness of breath.   Cardiovascular: Negative for chest pain, palpitations and leg swelling.  Gastrointestinal: Negative for abdominal distention, abdominal pain, constipation, diarrhea, nausea and vomiting.  Musculoskeletal: Negative.   Skin: Negative.   Neurological: Negative.   Psychiatric/Behavioral: Negative.       Objective:   Physical Exam  Constitutional: He is oriented to person, place, and time. He appears well-developed and well-nourished.  HENT:  Head: Normocephalic and atraumatic.  Eyes: EOM are normal.  Neck: Normal range of motion.  Cardiovascular: Normal rate and regular rhythm.  Pulmonary/Chest: Effort normal and breath sounds normal. No respiratory distress. He has no wheezes. He has no rales.  Abdominal: Soft. Bowel sounds are normal. He exhibits no distension. There is no tenderness. There is no rebound.  Musculoskeletal: He exhibits no edema.  Neurological: He is alert and oriented to person, place, and time. Coordination normal.  Skin: Skin is warm and dry.  Psychiatric: He has a normal mood and affect.   Vitals:   02/25/18 0814  BP: 140/90  Pulse: 70  Temp: 98.3 F (36.8 C)  TempSrc: Oral  SpO2: 97%  Weight: 144 lb (65.3 kg)  Height: 5\' 4"  (1.626 m)      Assessment & Plan:

## 2018-02-26 ENCOUNTER — Encounter: Payer: Self-pay | Admitting: Internal Medicine

## 2018-04-30 IMAGING — MR MR PELVIS W/O CM
6 of 8 series · 31 of 48 positions shown · non-contrast
Comparison: None.

CLINICAL DATA: Incomplete defecation. Rectal pain and bleeding.
Evaluate for rectal prolapse.

EXAM:
MRI PELVIS WITHOUT CONTRAST
TECHNIQUE: Multiplanar multisequence MR imaging of the pelvis was performed. No
intravenous contrast was administered.

[Series 5: T2 · axial · 4.0mm · 0.98mm/px · z∈[-44,+101]mm · 4 of 30 slices shown (1 of 6)]
[im 1/30]
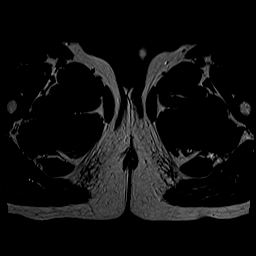
[im 10/30]
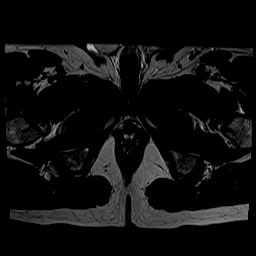
[im 20/30]
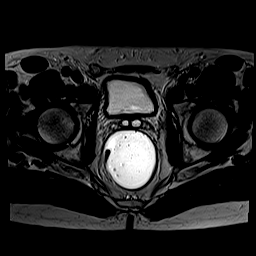
[im 30/30]
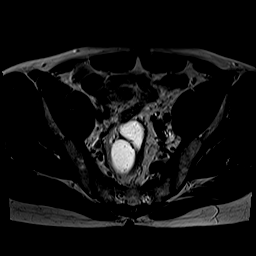

[Series 6: T2 · coronal · 4.0mm · 1.37mm/px · 3 of 28 slices shown (2 of 6)]
[im 1/28]
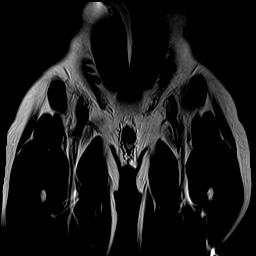
[im 14/28]
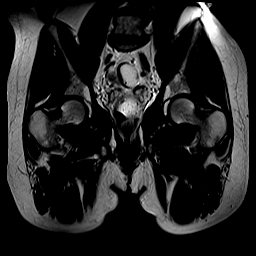
[im 28/28]
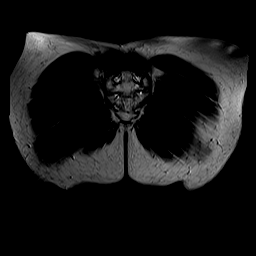

[Series 7: T2 · sagittal · 4.0mm · 0.98mm/px · 1 of 12 slices shown (3 of 6)]
[im 1/12]
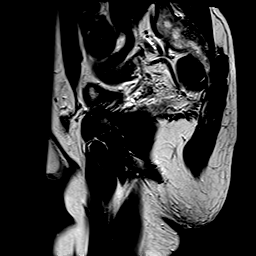

[Series 8: T2 · sagittal · 8.0mm · 0.66mm/px · 8 of 80 slices shown (4 of 6)]
[im 1/80]
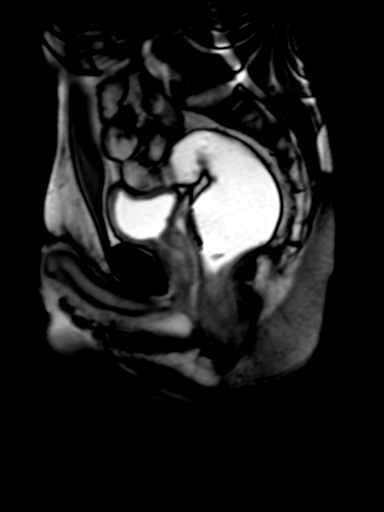
[im 12/80]
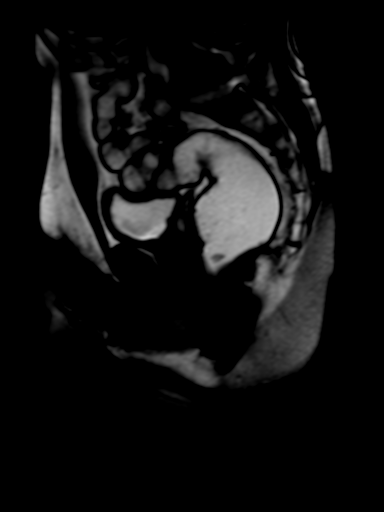
[im 23/80]
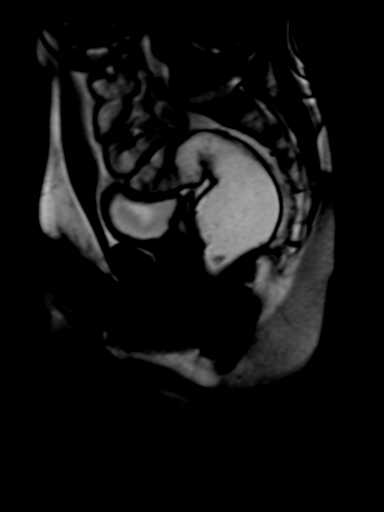
[im 34/80]
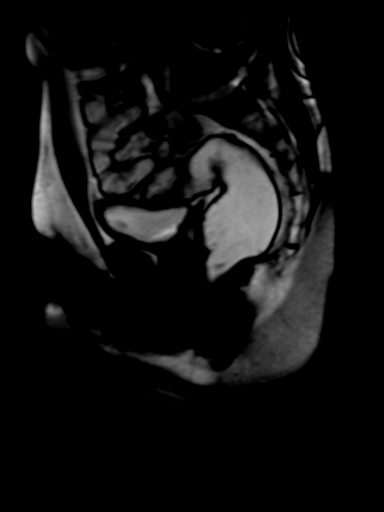
[im 46/80]
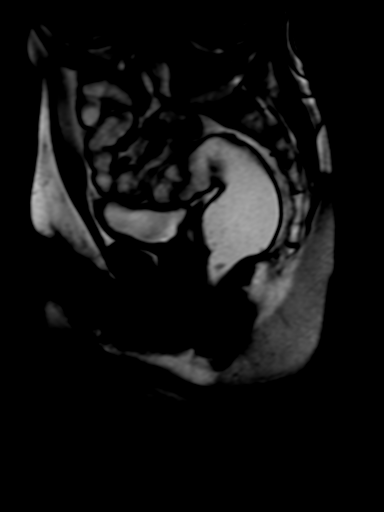
[im 57/80]
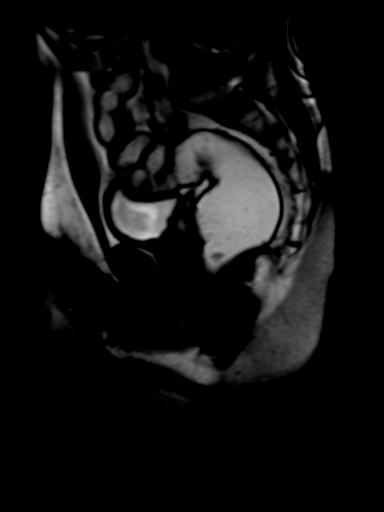
[im 68/80]
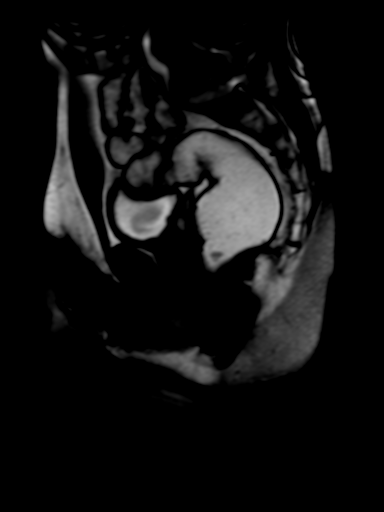
[im 80/80]
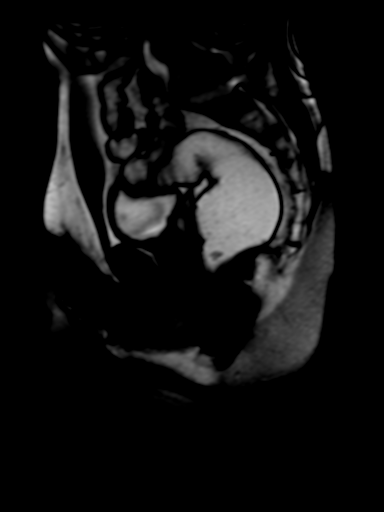

[Series 9: T2 · sagittal · 8.0mm · 0.66mm/px · 8 of 80 slices shown (5 of 6)]
[im 1/80]
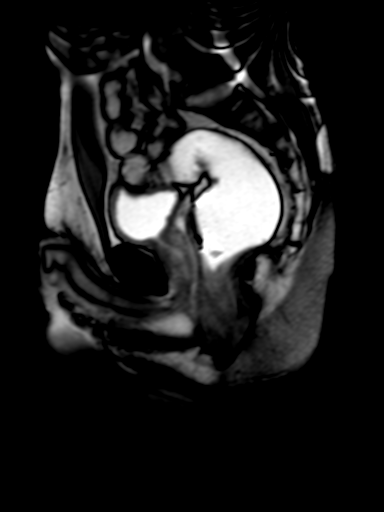
[im 12/80]
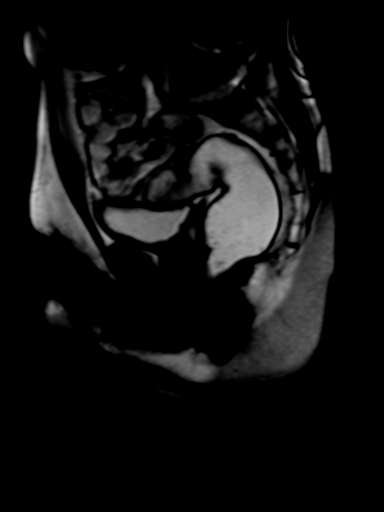
[im 23/80]
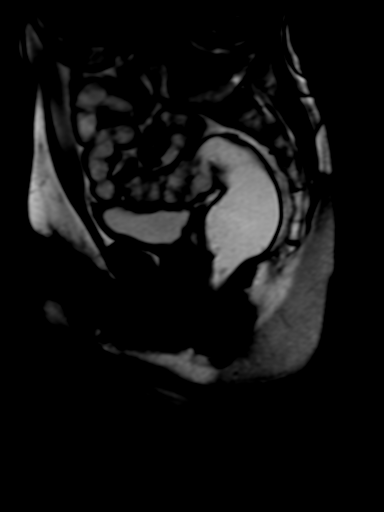
[im 34/80]
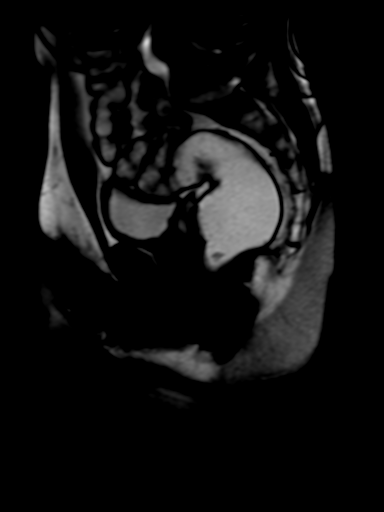
[im 46/80]
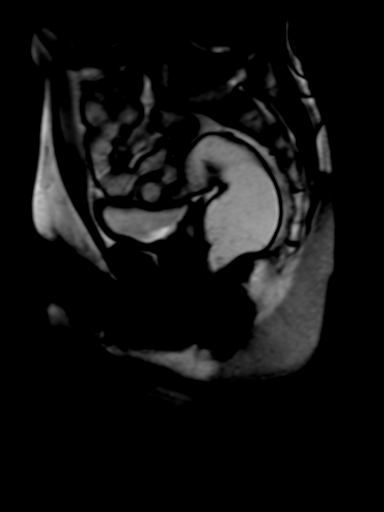
[im 57/80]
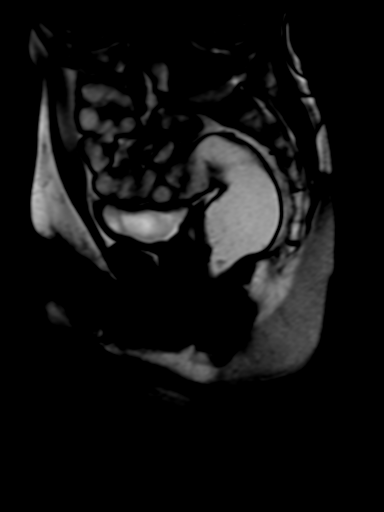
[im 68/80]
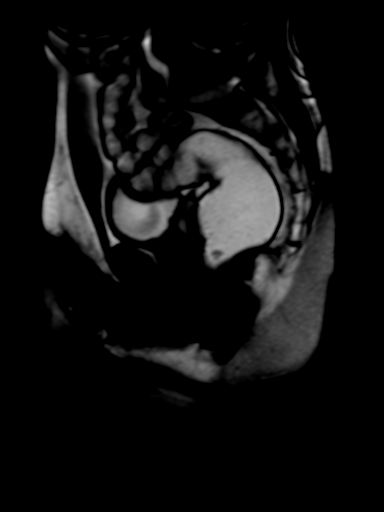
[im 80/80]
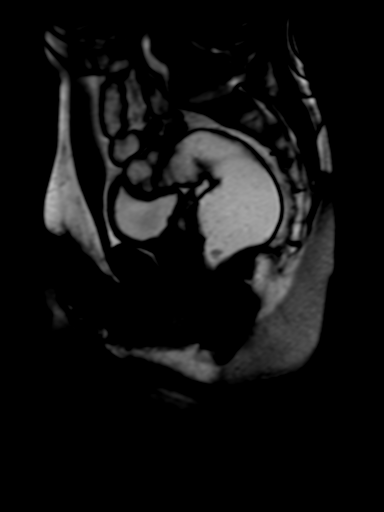

[Series 10: T2 · sagittal · 8.0mm · 0.66mm/px · 7 of 80 slices shown (6 of 6)]
[im 1/80]
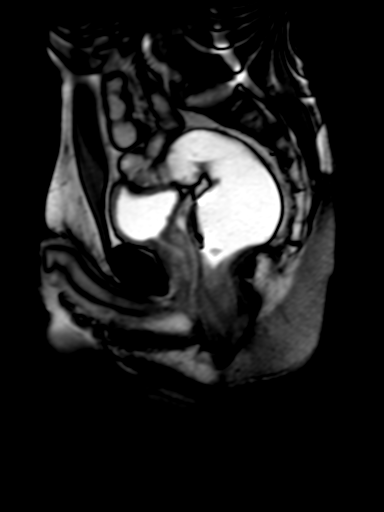
[im 12/80]
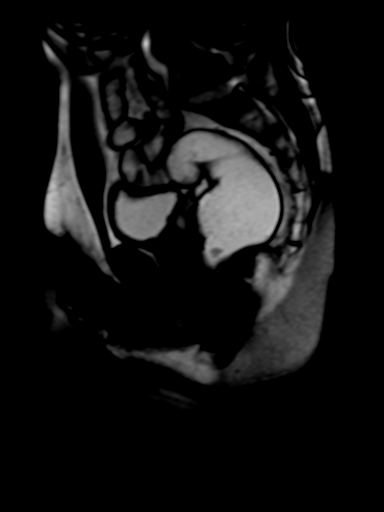
[im 23/80]
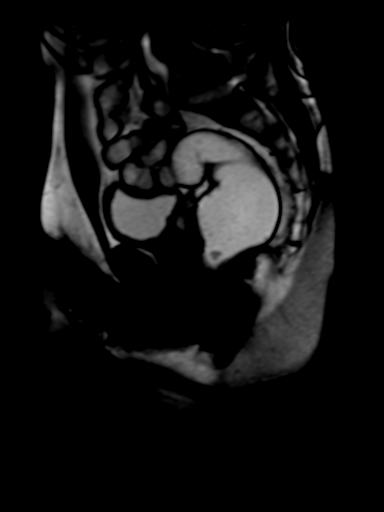
[im 34/80]
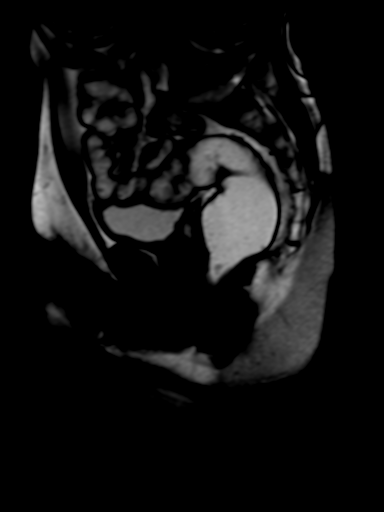
[im 46/80]
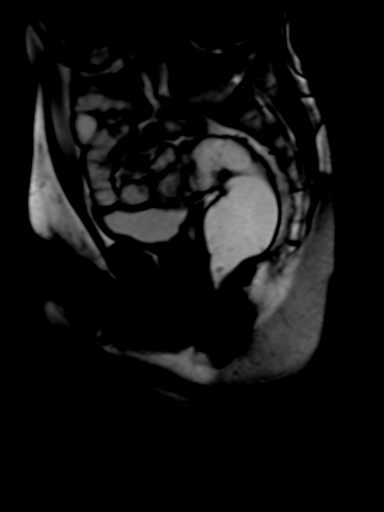
[im 57/80]
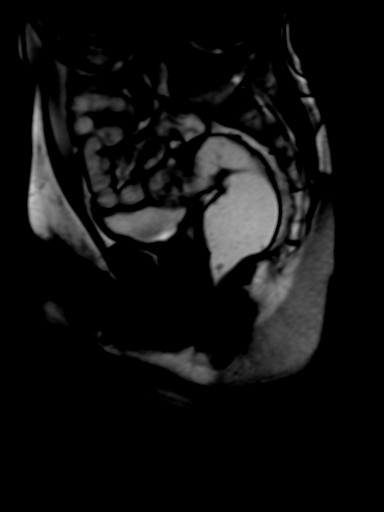
[im 68/80]
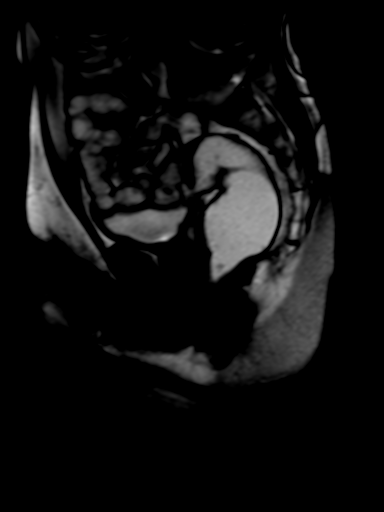

[31 of 48 positions shown; findings below may reference images not displayed]

FINDINGS: At rest, the levator ani musculature shows no significant thinning
or defects.

Measurements at rest:

H line:  4.5 cm

M line: 0.6 cm

Measurements during maximum strain/defecation:

H line: 4.3 cm

M line: 2.0 cm

ANTERIOR COMPARTMENT

Bladder base descent below PCL: 0 cm

Cystocele grade: Absent

Anterior urethral hypermobility >30 degrees: No

MIDDLE COMPARTMENT

Enterocele or Mesenterocele:  Absent

POSTERIOR COMPARTMENT

Rectocele: Absent

Levator plate angle >10 degrees from PCL:  Yes

Other findings during maximum strain/defecation: Incomplete rectal
emptying noted. No evidence of rectal intussusception.

Other:  Normal size prostate and seminal vesicles.
IMPRESSION: No evidence of rectal or pelvic organ collapse.

Incomplete rectal emptying noted.

## 2018-10-31 ENCOUNTER — Encounter: Payer: Self-pay | Admitting: Internal Medicine

## 2018-11-01 ENCOUNTER — Encounter: Payer: Self-pay | Admitting: Internal Medicine

## 2018-11-09 ENCOUNTER — Ambulatory Visit (INDEPENDENT_AMBULATORY_CARE_PROVIDER_SITE_OTHER): Payer: BC Managed Care – PPO | Admitting: Internal Medicine

## 2018-11-09 ENCOUNTER — Encounter: Payer: Self-pay | Admitting: Internal Medicine

## 2018-11-09 DIAGNOSIS — R6889 Other general symptoms and signs: Secondary | ICD-10-CM

## 2018-11-09 HISTORY — DX: Other general symptoms and signs: R68.89

## 2018-11-09 NOTE — Assessment & Plan Note (Signed)
Will check CBC, CMP, thyroid, ferritin, B12, TSH, free T4, testosterone, vitamin D.

## 2018-11-09 NOTE — Progress Notes (Signed)
Virtual Visit via Video Note  I connected with Jacalyn Lefevre on 11/09/18 at  9:20 AM EDT by an audio only enabled telemedicine application and verified that I am speaking with the correct person using two identifiers. He was unable to do video enabled visit.  The patient and the provider were at separate locations throughout the entire encounter.   I discussed the limitations of evaluation and management by telemedicine and the availability of in person appointments. The patient expressed understanding and agreed to proceed.  History of Present Illness: The patient is a 53 y.o. man with visit for feeling cold all the time. Started a few years ago and is worse in the last few months. Denies change in diet. Denies change in weight up or down. Denies blood in stool. Has internal hemorrhoids which ooze small drops of blood in the last few weeks. Has cold intolerance which is worsening. Denies change in diet or exercise. Denies fevers or chills or night sweats. Denies cough or SOB. Overall it is worsening. Has tried nothing.   Observations/Objective: Voice strong and even  Assessment and Plan: See problem oriented charting  Follow Up Instructions: labs to evaluate  I discussed the assessment and treatment plan with the patient. The patient was provided an opportunity to ask questions and all were answered. The patient agreed with the plan and demonstrated an understanding of the instructions.   Visit time 15 minutes: greater than 50% of that time was spent in voice to voice counseling and coordination of care with the patient: counseled about new symptoms of cold intolerance and fatigue.   The patient was advised to call back or seek an in-person evaluation if the symptoms worsen or if the condition fails to improve as anticipated.  Myrlene Broker, MD

## 2018-11-10 ENCOUNTER — Other Ambulatory Visit (INDEPENDENT_AMBULATORY_CARE_PROVIDER_SITE_OTHER): Payer: BC Managed Care – PPO

## 2018-11-10 DIAGNOSIS — E539 Vitamin B deficiency, unspecified: Secondary | ICD-10-CM | POA: Diagnosis not present

## 2018-11-10 DIAGNOSIS — R6889 Other general symptoms and signs: Secondary | ICD-10-CM

## 2018-11-10 DIAGNOSIS — E559 Vitamin D deficiency, unspecified: Secondary | ICD-10-CM

## 2018-11-10 DIAGNOSIS — Z0000 Encounter for general adult medical examination without abnormal findings: Secondary | ICD-10-CM

## 2018-11-10 LAB — COMPREHENSIVE METABOLIC PANEL
ALT: 14 U/L (ref 0–53)
AST: 17 U/L (ref 0–37)
Albumin: 4.2 g/dL (ref 3.5–5.2)
Alkaline Phosphatase: 58 U/L (ref 39–117)
BUN: 14 mg/dL (ref 6–23)
CO2: 27 mEq/L (ref 19–32)
Calcium: 8.8 mg/dL (ref 8.4–10.5)
Chloride: 106 mEq/L (ref 96–112)
Creatinine, Ser: 1 mg/dL (ref 0.40–1.50)
GFR: 78.07 mL/min (ref >60.00)
Glucose, Bld: 94 mg/dL (ref 70–99)
Potassium: 4.2 mEq/L (ref 3.5–5.1)
Sodium: 139 mEq/L (ref 135–145)
Total Bilirubin: 0.9 mg/dL (ref 0.2–1.2)
Total Protein: 6.6 g/dL (ref 6.0–8.3)

## 2018-11-10 LAB — CBC
HCT: 42.1 % (ref 39.0–52.0)
Hemoglobin: 14.3 g/dL (ref 13.0–17.0)
MCHC: 33.9 g/dL (ref 30.0–36.0)
MCV: 82.4 fl (ref 78.0–100.0)
Platelets: 164 10*3/uL (ref 150.0–400.0)
RBC: 5.11 Mil/uL (ref 4.22–5.81)
RDW: 14.6 % (ref 11.5–15.5)
WBC: 5.1 10*3/uL (ref 4.0–10.5)

## 2018-11-10 LAB — VITAMIN D 25 HYDROXY (VIT D DEFICIENCY, FRACTURES): VITD: 9.43 ng/mL — ABNORMAL LOW (ref 30.00–100.00)

## 2018-11-10 LAB — FERRITIN: Ferritin: 11.8 ng/mL — ABNORMAL LOW (ref 22.0–322.0)

## 2018-11-10 LAB — T4, FREE: Free T4: 0.78 ng/dL (ref 0.60–1.60)

## 2018-11-10 LAB — HEMOGLOBIN A1C: Hgb A1c MFr Bld: 5.9 % (ref 4.6–6.5)

## 2018-11-10 LAB — TSH: TSH: 4.14 u[IU]/mL (ref 0.35–4.50)

## 2018-11-10 LAB — VITAMIN B12: Vitamin B-12: 152 pg/mL — ABNORMAL LOW (ref 211–911)

## 2018-11-11 ENCOUNTER — Other Ambulatory Visit: Payer: Self-pay | Admitting: Internal Medicine

## 2018-11-11 ENCOUNTER — Telehealth: Payer: Self-pay | Admitting: Internal Medicine

## 2018-11-11 ENCOUNTER — Encounter: Payer: Self-pay | Admitting: Internal Medicine

## 2018-11-11 LAB — TESTOSTERONE, FREE, TOTAL, SHBG
Sex Hormone Binding: 22.7 nmol/L (ref 19.3–76.4)
Testosterone, Free: 7.5 pg/mL (ref 7.2–24.0)
Testosterone: 338 ng/dL (ref 264–916)

## 2018-11-11 MED ORDER — VITAMIN B-12 1000 MCG PO TABS: 1000.0000 ug | ORAL_TABLET | Freq: Every day | ORAL | 3 refills | Status: AC

## 2018-11-11 MED ORDER — VITAMIN D (ERGOCALCIFEROL) 1.25 MG (50000 UNIT) PO CAPS: 50000.0000 [IU] | ORAL_CAPSULE | ORAL | 0 refills | Status: DC

## 2018-11-11 MED FILL — VIT D2 1.25 MG (50,000 UNIT: 1.25 MG | 84 days supply | Qty: 12 | Fill #0

## 2018-11-11 NOTE — Telephone Encounter (Signed)
Pt. Given results and instructions. Scheduled for repeat lab work.

## 2018-11-27 ENCOUNTER — Encounter: Payer: Self-pay | Admitting: Internal Medicine

## 2018-12-10 ENCOUNTER — Encounter: Payer: Self-pay | Admitting: Internal Medicine

## 2019-01-07 ENCOUNTER — Encounter: Payer: Self-pay | Admitting: Internal Medicine

## 2019-01-08 ENCOUNTER — Other Ambulatory Visit: Payer: Self-pay | Admitting: Internal Medicine

## 2019-01-08 DIAGNOSIS — E538 Deficiency of other specified B group vitamins: Secondary | ICD-10-CM

## 2019-01-08 DIAGNOSIS — E559 Vitamin D deficiency, unspecified: Secondary | ICD-10-CM

## 2019-02-01 ENCOUNTER — Encounter: Payer: Self-pay | Admitting: Internal Medicine

## 2019-02-01 ENCOUNTER — Other Ambulatory Visit (INDEPENDENT_AMBULATORY_CARE_PROVIDER_SITE_OTHER): Payer: BC Managed Care – PPO

## 2019-02-01 DIAGNOSIS — E559 Vitamin D deficiency, unspecified: Secondary | ICD-10-CM

## 2019-02-01 DIAGNOSIS — E538 Deficiency of other specified B group vitamins: Secondary | ICD-10-CM

## 2019-02-01 LAB — VITAMIN B12: Vitamin B-12: 702 pg/mL (ref 211–911)

## 2019-02-01 LAB — VITAMIN D 25 HYDROXY (VIT D DEFICIENCY, FRACTURES): VITD: 49.78 ng/mL (ref 30.00–100.00)

## 2019-02-02 ENCOUNTER — Ambulatory Visit: Payer: BC Managed Care – PPO | Admitting: Internal Medicine

## 2019-09-06 ENCOUNTER — Encounter: Payer: Self-pay | Admitting: Internal Medicine

## 2019-09-07 ENCOUNTER — Ambulatory Visit: Payer: BC Managed Care – PPO | Admitting: Internal Medicine

## 2019-09-07 ENCOUNTER — Other Ambulatory Visit: Payer: Self-pay

## 2019-09-07 ENCOUNTER — Encounter: Payer: Self-pay | Admitting: Internal Medicine

## 2019-09-07 VITALS — BP 122/82 | HR 92 | Temp 98.5°F | Ht 64.0 in | Wt 144.0 lb

## 2019-09-07 DIAGNOSIS — R0789 Other chest pain: Secondary | ICD-10-CM

## 2019-09-07 DIAGNOSIS — E559 Vitamin D deficiency, unspecified: Secondary | ICD-10-CM

## 2019-09-07 DIAGNOSIS — R5383 Other fatigue: Secondary | ICD-10-CM | POA: Insufficient documentation

## 2019-09-07 DIAGNOSIS — E538 Deficiency of other specified B group vitamins: Secondary | ICD-10-CM

## 2019-09-07 HISTORY — DX: Other fatigue: R53.83

## 2019-09-07 HISTORY — DX: Other chest pain: R07.89

## 2019-09-07 HISTORY — DX: Vitamin D deficiency, unspecified: E55.9

## 2019-09-07 HISTORY — DX: Deficiency of other specified B group vitamins: E53.8

## 2019-09-07 LAB — CBC
HCT: 44.8 % (ref 39.0–52.0)
Hemoglobin: 14.9 g/dL (ref 13.0–17.0)
MCHC: 33.2 g/dL (ref 30.0–36.0)
MCV: 84.2 fl (ref 78.0–100.0)
Platelets: 183 10*3/uL (ref 150.0–400.0)
RBC: 5.32 Mil/uL (ref 4.22–5.81)
RDW: 14 % (ref 11.5–15.5)
WBC: 5.5 10*3/uL (ref 4.0–10.5)

## 2019-09-07 LAB — TSH: TSH: 2.68 u[IU]/mL (ref 0.35–4.50)

## 2019-09-07 LAB — COMPREHENSIVE METABOLIC PANEL
ALT: 16 U/L (ref 0–53)
AST: 17 U/L (ref 0–37)
Albumin: 4.2 g/dL (ref 3.5–5.2)
Alkaline Phosphatase: 63 U/L (ref 39–117)
BUN: 11 mg/dL (ref 6–23)
CO2: 30 mEq/L (ref 19–32)
Calcium: 9.6 mg/dL (ref 8.4–10.5)
Chloride: 103 mEq/L (ref 96–112)
Creatinine, Ser: 1.07 mg/dL (ref 0.40–1.50)
GFR: 71.98 mL/min (ref >60.00)
Glucose, Bld: 108 mg/dL — ABNORMAL HIGH (ref 70–99)
Potassium: 4.4 mEq/L (ref 3.5–5.1)
Sodium: 137 mEq/L (ref 135–145)
Total Bilirubin: 0.9 mg/dL (ref 0.2–1.2)
Total Protein: 7.2 g/dL (ref 6.0–8.3)

## 2019-09-07 LAB — LIPID PANEL
Cholesterol: 192 mg/dL (ref 0–200)
HDL: 42.8 mg/dL (ref >39.00)
LDL Cholesterol: 113 mg/dL — ABNORMAL HIGH (ref 0–99)
NonHDL: 149.02
Total CHOL/HDL Ratio: 4
Triglycerides: 182 mg/dL — ABNORMAL HIGH (ref 0.0–149.0)
VLDL: 36.4 mg/dL (ref 0.0–40.0)

## 2019-09-07 LAB — VITAMIN B12: Vitamin B-12: >1500 pg/mL — ABNORMAL HIGH (ref 211–911)

## 2019-09-07 LAB — VITAMIN D 25 HYDROXY (VIT D DEFICIENCY, FRACTURES): VITD: 30.06 ng/mL (ref 30.00–100.00)

## 2019-09-07 NOTE — Assessment & Plan Note (Signed)
EKG done at office without changes and no abnormality. Referral to cardiology for assessment of need for stress testing. Checking lipid panel today.

## 2019-09-07 NOTE — Progress Notes (Signed)
   Subjective:   Patient ID: Darius Velasquez, male    DOB: 12/16/1965, 54 y.o.   MRN: [id number]  HPI The patient is a 53 YO man coming in for concerns about high blood pressure readings with home meter and some chest pains with the high readings. He has noticed that he will get a twinge in his chest with high readings of <170 for top number. Denies headaches. Overall is feeling well. Not as active lately. Denies SOB. Some fatigue lately.   Review of Systems  Constitutional: Positive for fatigue.  HENT: Negative.   Eyes: Negative.   Respiratory: Negative for cough, chest tightness and shortness of breath.   Cardiovascular: Positive for chest pain. Negative for palpitations and leg swelling.  Gastrointestinal: Negative for abdominal distention, abdominal pain, constipation, diarrhea, nausea and vomiting.  Musculoskeletal: Negative.   Skin: Negative.   Neurological: Negative.   Psychiatric/Behavioral: Negative.     Objective:  Physical Exam Constitutional:      Appearance: He is well-developed.  HENT:     Head: Normocephalic and atraumatic.  Cardiovascular:     Rate and Rhythm: Normal rate and regular rhythm.  Pulmonary:     Effort: Pulmonary effort is normal. No respiratory distress.     Breath sounds: Normal breath sounds. No wheezing or rales.  Abdominal:     General: Bowel sounds are normal. There is no distension.     Palpations: Abdomen is soft.     Tenderness: There is no abdominal tenderness. There is no rebound.  Musculoskeletal:     Cervical back: Normal range of motion.  Skin:    General: Skin is warm and dry.  Neurological:     Mental Status: He is alert and oriented to person, place, and time.     Coordination: Coordination normal.     Vitals:   09/07/19 1329  BP: 122/82  Pulse: 92  Temp: 98.5 F (36.9 C)  TempSrc: Oral  SpO2: 96%  Weight: 144 lb (65.3 kg)  Height: 5\' 4"  (1.626 m)   EKG: Rate 80, axis normal, interval normal, sinus, no st or t wave  changes, no significant change when compared to prior 2015  This visit occurred during the SARS-CoV-2 public health emergency.  Safety protocols were in place, including screening questions prior to the visit, additional usage of staff PPE, and extensive cleaning of exam room while observing appropriate contact time as indicated for disinfecting solutions.   Assessment & Plan:

## 2019-09-07 NOTE — Patient Instructions (Addendum)
The EKG today was the same as the one before. We will check some labs today.   We will get you in with a cardiologist to make sure the heart is okay.

## 2019-09-07 NOTE — Assessment & Plan Note (Signed)
Checking B12, vitamin D, thyroid, CBC, CMP today to exclude metabolic causes.

## 2019-09-07 NOTE — Assessment & Plan Note (Signed)
Needs recheck today, adjust as needed.

## 2019-09-07 NOTE — Assessment & Plan Note (Signed)
Needs recheck today. Adjust as needed.

## 2019-09-08 ENCOUNTER — Encounter: Payer: Self-pay | Admitting: Internal Medicine

## 2019-09-09 ENCOUNTER — Ambulatory Visit: Payer: BC Managed Care – PPO | Attending: Internal Medicine

## 2019-09-09 DIAGNOSIS — Z23 Encounter for immunization: Secondary | ICD-10-CM

## 2019-09-09 NOTE — Progress Notes (Signed)
   Covid-19 Vaccination Clinic  Name:  Torre Schaumburg    MRN: [id number] DOB: Jun 23, 1966  09/09/2019  Mr. Feldner was observed post Covid-19 immunization for 15 minutes without incident. He was provided with Vaccine Information Sheet and instruction to access the V-Safe system.   Mr. Polio was instructed to call 911 with any severe reactions post vaccine: Marland Kitchen Difficulty breathing  . Swelling of face and throat  . A fast heartbeat  . A bad rash all over body  . Dizziness and weakness   Immunizations Administered    Name Date Dose VIS Date Route   Moderna COVID-19 Vaccine 09/09/2019 12:30 PM 0.5 mL 06/01/2019 Intramuscular   Manufacturer: Moderna   Lot: [id number]   NDC: 00174-944-96

## 2019-09-20 ENCOUNTER — Encounter: Payer: Self-pay | Admitting: Internal Medicine

## 2019-09-20 ENCOUNTER — Encounter: Payer: Self-pay | Admitting: Interventional Cardiology

## 2019-09-20 ENCOUNTER — Ambulatory Visit: Payer: BC Managed Care – PPO | Admitting: Interventional Cardiology

## 2019-09-20 ENCOUNTER — Other Ambulatory Visit: Payer: Self-pay

## 2019-09-20 VITALS — BP 150/88 | HR 80 | Ht 64.0 in | Wt 142.0 lb

## 2019-09-20 DIAGNOSIS — I10 Essential (primary) hypertension: Secondary | ICD-10-CM | POA: Diagnosis not present

## 2019-09-20 DIAGNOSIS — R0600 Dyspnea, unspecified: Secondary | ICD-10-CM | POA: Diagnosis not present

## 2019-09-20 DIAGNOSIS — R072 Precordial pain: Secondary | ICD-10-CM | POA: Diagnosis not present

## 2019-09-20 DIAGNOSIS — R0609 Other forms of dyspnea: Secondary | ICD-10-CM

## 2019-09-20 MED ORDER — METOPROLOL TARTRATE 100 MG PO TABS: ORAL_TABLET | ORAL | 0 refills | Status: DC

## 2019-09-20 MED ORDER — LISINOPRIL 10 MG PO TABS: 10.0000 mg | ORAL_TABLET | Freq: Every day | ORAL | 3 refills | Status: DC

## 2019-09-20 MED FILL — METOPROLOL TARTRATE 100 MG: 100 | 1 days supply | Qty: 1 | Fill #0

## 2019-09-20 MED FILL — LISINOPRIL 10 MG TABS: 10 | 90 days supply | Qty: 90 | Fill #0

## 2019-09-20 NOTE — Patient Instructions (Addendum)
Medication Instructions:  Your physician has recommended you make the following change in your medication:   START: lisinopril 10 mg once a day  *If you need a refill on your cardiac medications before your next appointment, please call your pharmacy*   Lab Work: Your physician recommends that you return for lab work (BMET) on 09/28/19  If you have labs (blood work) drawn today and your tests are completely normal, you will receive your results only by: Marland Kitchen MyChart Message (if you have MyChart) OR . A paper copy in the mail If you have any lab test that is abnormal or we need to change your treatment, we will call you to review the results.   Testing/Procedures: Your physician has requested that you have cardiac CT. Cardiac computed tomography (CT) is a painless test that uses an x-ray machine to take clear, detailed pictures of your heart. For further information please visit [email or web address] Please follow instruction sheet as given.   Follow-Up: After Cardiac CT   Other Instructions Your cardiac CT will be scheduled at one of the below locations:   [facility] [address] [place], [place] [address] [phone]  OR  [facility] [address] [place], [place] [address] [phone]  If scheduled at [facility], please arrive at the [facility] main entrance of [facility] 30 minutes prior to test start time. Proceed to the [facility] Radiology Department (first floor) to check-in and test prep.  If scheduled at [facility], please arrive 15 mins early for check-in and test prep.  Please follow these instructions carefully (unless otherwise directed):  Hold all erectile dysfunction medications at least 3 days (72 hrs) prior to test.  On the Night Before the Test: . Be sure to Drink plenty of water. . Do not consume any caffeinated/decaffeinated beverages or  chocolate 12 hours prior to your test. . Do not take any antihistamines 12 hours prior to your test.   On the Day of the Test: . Drink plenty of water. Do not drink any water within one hour of the test. . Do not eat any food 4 hours prior to the test. . You may take your regular medications prior to the test.  . Take metoprolol (Lopressor) 100 MG two hours prior to test.       After the Test: . Drink plenty of water. . After receiving IV contrast, you may experience a mild flushed feeling. This is normal. . On occasion, you may experience a mild rash up to 24 hours after the test. This is not dangerous. If this occurs, you can take Benadryl 25 mg and increase your fluid intake. . If you experience trouble breathing, this can be serious. If it is severe call 911 IMMEDIATELY. If it is mild, please call our office.    Once we have confirmed authorization from your insurance company, we will call you to set up a date and time for your test.   For non-scheduling related questions, please contact the cardiac imaging nurse navigator should you have any questions/concerns: Rockwell Alexandria, RN Navigator Cardiac Imaging [facility] [phone] office  For scheduling needs, including cancellations and rescheduling, please call [phone].

## 2019-09-20 NOTE — Progress Notes (Signed)
Cardiology Office Note   Date:  09/20/2019   ID:  Darius Velasquez, DOB September 10, 1965, MRN [id number]  PCP:  Darius Broker, MD    No chief complaint on file.  Chest discomfort  Wt Readings from Last 3 Encounters:  09/20/19 142 lb (64.4 kg)  09/07/19 144 lb (65.3 kg)  02/25/18 144 lb (65.3 kg)       History of Present Illness: Darius Velasquez is a 54 y.o. male who is being seen today for the evaluation of chest pain at the request of Darius Velasquez, *.   Bp has been variable.  It can be anywhere from 110-180.  With the higher BPs, he can have some chest discomfort.    He does yoga and regular walking.  He can go for 2 miles and has no problems.  He has had some chest pain when his BP is increased.   Older brother with HTN.    He has not had any stress test in the past.   Denies :  Dizziness. Leg edema. Nitroglycerin use. Orthopnea. Palpitations. Paroxysmal nocturnal dyspnea. Shortness of breath. Syncope.     Past Medical History:  Diagnosis Date  . Acute upper respiratory infection 02/06/2016  . Atypical chest pain 09/07/2019  . B12 deficiency 09/07/2019  . Blood in stool 01/03/2015  . Cold intolerance 11/09/2018  . Fatigue 09/07/2019  . High blood pressure   . Right foot pain 01/03/2015  . Vitamin D deficiency 09/07/2019    No past surgical history on file.   Current Outpatient Medications  Medication Sig Dispense Refill  . Omega 3 1000 MG CAPS Take 1 capsule by mouth daily.    . vitamin B-12 (CYANOCOBALAMIN) 1000 MCG tablet Take 1 tablet (1,000 mcg total) by mouth daily. 90 tablet 3  . Vitamin D, Ergocalciferol, (DRISDOL) 1.25 MG (50000 UT) CAPS capsule Take 1 capsule (50,000 Units total) by mouth every 7 (seven) days. 12 capsule 0   No current facility-administered medications for this visit.    Allergies:   Patient has no known allergies.    Social History:  The patient  reports that he has never smoked. He has never used smokeless tobacco. He reports  current alcohol use. He reports that he does not use drugs.   Family History:  The patient's family history includes High Cholesterol in his mother.    ROS:  Please see the history of present illness.   Otherwise, review of systems are positive for chest pain.   All other systems are reviewed and negative.    PHYSICAL EXAM: VS:  BP (!) 150/88   Pulse 80   Ht 5\' 4"  (1.626 m)   Wt 142 lb (64.4 kg)   SpO2 98%   BMI 24.37 kg/m  , BMI Body mass index is 24.37 kg/m. GEN: Well nourished, well developed, in no acute distress  HEENT: normal  Neck: no JVD, carotid bruits, or masses Cardiac: RRR; S1, S2, no rubs or gallops Respiratory:  clear to auscultation bilaterally, normal work of breathing GI: soft, nontender, nondistended, + BS MS: no deformity or atrophy  Skin: warm and dry, no rash Neuro:  Strength and sensation are intact Psych: euthymic mood, full affect   EKG:   The ekg ordered in early March demonstrates NSR, nonspecific ST changes, no change from 2015   Recent Labs: 09/07/2019: ALT 16; BUN 11; Creatinine, Ser 1.07; Hemoglobin 14.9; Platelets 183.0; Potassium 4.4; Sodium 137; TSH 2.68   Lipid Panel  Component Value Date/Time   CHOL 192 09/07/2019 1403   TRIG 182.0 (H) 09/07/2019 1403   HDL 42.80 09/07/2019 1403   CHOLHDL 4 09/07/2019 1403   VLDL 36.4 09/07/2019 1403   LDLCALC 113 (H) 09/07/2019 1403   LDLDIRECT 133.0 11/08/2016 1409     Other studies Reviewed: Additional studies/ records that were reviewed today with results demonstrating: ECG from PMD reviewed.   ASSESSMENT AND PLAN:  1. Precordial Chest pain :  Some sx with exertion in a patient with RF for CAD including new HTN- DOE.  Check Coronary CTA.  CP with increased BP.   2. HTN: Start lisinopril.  BMet in 1 week.  Decrease salt intake.  3. LDL may need to be addresssed with statin if calcium score is high.    Current medicines are reviewed at length with the patient today.  The patient concerns  regarding his medicines were addressed.  The following changes have been made:  No change  Labs/ tests ordered today include:  No orders of the defined types were placed in this encounter.   Recommend 150 minutes/week of aerobic exercise Low fat, low carb, high fiber diet recommended  Disposition:   FU in after coronary CTA.   Signed, Larae Grooms, MD  09/20/2019 4:30 PM    [address], [place], [place]  [address] Phone: [phone]; Fax: [phone]

## 2019-09-28 ENCOUNTER — Other Ambulatory Visit: Payer: Self-pay

## 2019-09-28 ENCOUNTER — Other Ambulatory Visit: Payer: BC Managed Care – PPO

## 2019-09-28 DIAGNOSIS — I10 Essential (primary) hypertension: Secondary | ICD-10-CM

## 2019-09-28 DIAGNOSIS — R072 Precordial pain: Secondary | ICD-10-CM

## 2019-09-29 LAB — BASIC METABOLIC PANEL
BUN/Creatinine Ratio: 8 — ABNORMAL LOW (ref 9–20)
BUN: 8 mg/dL (ref 6–24)
CO2: 22 mmol/L (ref 20–29)
Calcium: 9.4 mg/dL (ref 8.7–10.2)
Chloride: 106 mmol/L (ref 96–106)
Creatinine, Ser: 0.98 mg/dL (ref 0.76–1.27)
GFR calc Af Amer: 101 mL/min/{1.73_m2} (ref >59)
GFR calc non Af Amer: 87 mL/min/{1.73_m2} (ref >59)
Glucose: 90 mg/dL (ref 65–99)
Potassium: 4.6 mmol/L (ref 3.5–5.2)
Sodium: 141 mmol/L (ref 134–144)

## 2019-10-12 ENCOUNTER — Ambulatory Visit: Payer: BC Managed Care – PPO | Attending: Family

## 2019-10-12 DIAGNOSIS — Z23 Encounter for immunization: Secondary | ICD-10-CM

## 2019-10-12 NOTE — Progress Notes (Signed)
   Covid-19 Vaccination Clinic  Name:  Ermon Sagan    MRN: [id number] DOB: 1965/12/15  10/12/2019  Darius Velasquez was observed post Covid-19 immunization for 15 minutes without incident. He was provided with Vaccine Information Sheet and instruction to access the V-Safe system.   [name] was instructed to call 911 with any severe reactions post vaccine: Marland Kitchen Difficulty breathing  . Swelling of face and throat  . A fast heartbeat  . A bad rash all over body  . Dizziness and weakness   Immunizations Administered    Name Date Dose VIS Date Route   Moderna COVID-19 Vaccine 10/12/2019 12:08 PM 0.5 mL 06/01/2019 Intramuscular   Manufacturer: Moderna   Lot: [id number]   NDC: 41638-453-64

## 2019-10-18 ENCOUNTER — Encounter (HOSPITAL_COMMUNITY): Payer: Self-pay

## 2019-10-18 ENCOUNTER — Telehealth (HOSPITAL_COMMUNITY): Payer: Self-pay | Admitting: Emergency Medicine

## 2019-10-18 NOTE — Telephone Encounter (Signed)
Reaching out to patient to offer assistance regarding upcoming cardiac imaging study; pt verbalizes understanding of appt date/time, parking situation and where to check in, pre-test NPO status and medications ordered, and verified current allergies; name and call back number provided for further questions should they arise Rockwell Alexandria RN Navigator Cardiac Imaging [facility] [phone] office [phone] cell   Pt requesting total cost for test- number for financial/billing given, pt appreciated call

## 2019-10-19 ENCOUNTER — Ambulatory Visit (HOSPITAL_COMMUNITY)
Admission: RE | Admit: 2019-10-19 | Discharge: 2019-10-19 | Disposition: A | Discharge status: Home / Self Care | Payer: BC Managed Care – PPO | Source: Ambulatory Visit | Attending: Interventional Cardiology | Admitting: Interventional Cardiology

## 2019-10-19 ENCOUNTER — Encounter (HOSPITAL_COMMUNITY): Payer: Self-pay

## 2019-10-19 ENCOUNTER — Other Ambulatory Visit: Payer: Self-pay

## 2019-10-19 DIAGNOSIS — I10 Essential (primary) hypertension: Secondary | ICD-10-CM | POA: Insufficient documentation

## 2019-10-19 DIAGNOSIS — R072 Precordial pain: Secondary | ICD-10-CM | POA: Diagnosis not present

## 2019-10-19 MED ORDER — NITROGLYCERIN 0.4 MG SL SUBL
0.8000 mg | SUBLINGUAL_TABLET | Freq: Once | SUBLINGUAL | Status: AC
  Administered 2019-10-19: 0.8 mg via SUBLINGUAL

## 2019-10-19 MED ORDER — IOHEXOL 350 MG/ML SOLN
80.0000 mL | Freq: Once | INTRAVENOUS | Status: AC | PRN
  Administered 2019-10-19: 80 mL via INTRAVENOUS

## 2019-10-19 MED ORDER — NITROGLYCERIN 0.4 MG SL SUBL
SUBLINGUAL_TABLET | SUBLINGUAL | Status: AC
  Filled 2019-10-19: qty 2

## 2019-10-20 ENCOUNTER — Ambulatory Visit (HOSPITAL_COMMUNITY)
Admission: RE | Admit: 2019-10-20 | Discharge: 2019-10-20 | Disposition: A | Discharge status: Home / Self Care | Payer: BC Managed Care – PPO | Source: Ambulatory Visit | Attending: Interventional Cardiology | Admitting: Interventional Cardiology

## 2019-10-20 DIAGNOSIS — I10 Essential (primary) hypertension: Secondary | ICD-10-CM | POA: Diagnosis present

## 2019-10-20 DIAGNOSIS — R072 Precordial pain: Secondary | ICD-10-CM | POA: Diagnosis present

## 2019-10-21 ENCOUNTER — Telehealth: Payer: Self-pay | Admitting: Interventional Cardiology

## 2019-10-21 ENCOUNTER — Other Ambulatory Visit: Payer: Self-pay

## 2019-10-21 MED ORDER — ATORVASTATIN CALCIUM 20 MG PO TABS: 20.0000 mg | ORAL_TABLET | Freq: Every day | ORAL | 3 refills | Status: DC

## 2019-10-21 MED FILL — ATORVASTATIN 20 MG TABLET: 20 | 90 days supply | Qty: 90 | Fill #0

## 2019-10-21 NOTE — Telephone Encounter (Signed)
   I went in  Pt's chart to see who called him yesterday(10-20-19)

## 2019-10-26 NOTE — Progress Notes (Signed)
Cardiology Office Note   Date:  10/27/2019   ID:  Darius Velasquez, DOB August 23, 1965, MRN [id number]  PCP:  Myrlene Broker, MD    No chief complaint on file.  CAD  Wt Readings from Last 3 Encounters:  10/27/19 139 lb 12.8 oz (63.4 kg)  09/20/19 142 lb (64.4 kg)  09/07/19 144 lb (65.3 kg)       History of Present Illness: Darius Velasquez is a 54 y.o. male  Who had chest pain in 3/21.  Bp had been variable.  It was anywhere from 110-180.  With the higher BPs, he can have some chest discomfort.    He does yoga and regular walking.  He can go for 2 miles and has no problems.  He has had some chest pain when his BP is increased.   Older brother with HTN.    4/21 Coronary CT showed: "1. Coronary calcium score of 89. This was 95 percentile for age and sex matched control.  2. Normal coronary origin with right dominance.  3. LAD is a large vessel that has proximal focal calcified plaque with 0-24% stenosis. There is a focal stenosis 25-49% at the junction of mid to distal LAD (just after the bifurcation of the second diagonal). Will send for CT-FFR analysis to evaluate flow.  4.  Distal aortic atherosclerosis."  FFR was negative. : "IMPRESSION: LAD - proximal 0.98, mid 0.95, distal (after second diagonal) 0.83. FFR <0.8 is considered abnormal. Distal LAD lesion is moderate.  LM, RCA, Circ are normal."  Since the last visit, BP goes up to 160s.  It happens at different times of the day.    Denies : exertional Chest pain. Dizziness. Leg edema. Nitroglycerin use. Orthopnea. Palpitations. Paroxysmal nocturnal dyspnea. Shortness of breath. Syncope.   Has some random chest pain not related to exertion.  Has an occasional cough.  Some chest pain related to cough.    Past Medical History:  Diagnosis Date  . Acute upper respiratory infection 02/06/2016  . Atypical chest pain 09/07/2019  . B12 deficiency 09/07/2019  . Blood in stool 01/03/2015  . Cold intolerance 11/09/2018    . Fatigue 09/07/2019  . High blood pressure   . Right foot pain 01/03/2015  . Vitamin D deficiency 09/07/2019    No past surgical history on file.   Current Outpatient Medications  Medication Sig Dispense Refill  . atorvastatin (LIPITOR) 20 MG tablet Take 1 tablet (20 mg total) by mouth daily. 90 tablet 3  . lisinopril (ZESTRIL) 10 MG tablet Take 1 tablet (10 mg total) by mouth daily. 90 tablet 3  . Omega 3 1000 MG CAPS Take 1 capsule by mouth daily.    . vitamin B-12 (CYANOCOBALAMIN) 1000 MCG tablet Take 1 tablet (1,000 mcg total) by mouth daily. 90 tablet 3  . Vitamin D, Ergocalciferol, (DRISDOL) 1.25 MG (50000 UT) CAPS capsule Take 1 capsule (50,000 Units total) by mouth every 7 (seven) days. 12 capsule 0   No current facility-administered medications for this visit.    Allergies:   Patient has no known allergies.    Social History:  The patient  reports that he has never smoked. He has never used smokeless tobacco. He reports current alcohol use. He reports that he does not use drugs.   Family History:  The patient's family history includes High Cholesterol in his mother.    ROS:  Please see the history of present illness.   Otherwise, review of systems are positive for occasional  cough.   All other systems are reviewed and negative.    PHYSICAL EXAM: VS:  BP 118/74   Pulse 71   Ht 5\' 4"  (1.626 m)   Wt 139 lb 12.8 oz (63.4 kg)   SpO2 96%   BMI 24.00 kg/m  , BMI Body mass index is 24 kg/m. GEN: Well nourished, well developed, in no acute distress  HEENT: normal  Neck: no JVD, carotid bruits, or masses Cardiac: RRR; no murmurs, rubs, or gallops,no edema  Respiratory:  clear to auscultation bilaterally, normal work of breathing GI: soft, nontender, nondistended, + BS MS: no deformity or atrophy  Skin: warm and dry, no rash Neuro:  Strength and sensation are intact Psych: euthymic mood, full affect   EKG:   The ekg ordered today demonstrates    Recent  Labs: 09/07/2019: ALT 16; Hemoglobin 14.9; Platelets 183.0; TSH 2.68 09/28/2019: BUN 8; Creatinine, Ser 0.98; Potassium 4.6; Sodium 141   Lipid Panel    Component Value Date/Time   CHOL 192 09/07/2019 1403   TRIG 182.0 (H) 09/07/2019 1403   HDL 42.80 09/07/2019 1403   CHOLHDL 4 09/07/2019 1403   VLDL 36.4 09/07/2019 1403   LDLCALC 113 (H) 09/07/2019 1403   LDLDIRECT 133.0 11/08/2016 1409     Other studies Reviewed: Additional studies/ records that were reviewed today with results demonstrating: LDL 113.     ASSESSMENT AND PLAN:  1. Coronary calcification: High calcium score.   No anginal sx. Negative FFR by CT scan.  No CP with walking.   2. HTN: BP still high. Given his cough, could switch to ARB, irbesartan 75 mg daily.  Controlled today on my recheck, 124/82.   3. Hyperlipidemia: LDL 113.  Atorvastatin started.  REcheck labs in 7/21. 4. Aortic atherosclerosis: Aggressive lipid management. Whole food plant based diet.  Decreasing fried foods.  5. Prediabetic: A1C 5.9 in 10/2018.  Decrease sugar intake.  Dietary info given.  Recheck A1C in 7/21.   Current medicines are reviewed at length with the patient today.  The patient concerns regarding his medicines were addressed.  The following changes have been made:  No change  Labs/ tests ordered today include: No orders of the defined types were placed in this encounter.   Recommend 150 minutes/week of aerobic exercise Low fat, low carb, high fiber diet recommended  Disposition:   FU in 1 year or sooner if he has any problems   Signed, Larae Grooms, MD  10/27/2019 11:59 AM    [address], [place], [place]  [address] Phone: [phone]; Fax: [phone]

## 2019-10-27 ENCOUNTER — Other Ambulatory Visit: Payer: Self-pay

## 2019-10-27 ENCOUNTER — Encounter: Payer: Self-pay | Admitting: Interventional Cardiology

## 2019-10-27 ENCOUNTER — Ambulatory Visit: Payer: BC Managed Care – PPO | Admitting: Interventional Cardiology

## 2019-10-27 VITALS — BP 118/74 | HR 71 | Ht 64.0 in | Wt 139.8 lb

## 2019-10-27 DIAGNOSIS — I2584 Coronary atherosclerosis due to calcified coronary lesion: Secondary | ICD-10-CM

## 2019-10-27 DIAGNOSIS — I2510 Atherosclerotic heart disease of native coronary artery without angina pectoris: Secondary | ICD-10-CM | POA: Diagnosis not present

## 2019-10-27 DIAGNOSIS — I700 Atherosclerosis of aorta: Secondary | ICD-10-CM | POA: Diagnosis not present

## 2019-10-27 DIAGNOSIS — E782 Mixed hyperlipidemia: Secondary | ICD-10-CM | POA: Diagnosis not present

## 2019-10-27 DIAGNOSIS — R7303 Prediabetes: Secondary | ICD-10-CM

## 2019-10-27 DIAGNOSIS — I10 Essential (primary) hypertension: Secondary | ICD-10-CM

## 2019-10-27 NOTE — Patient Instructions (Signed)
Medication Instructions:  Your physician recommends that you continue on your current medications as directed. Please refer to the Current Medication list given to you today.  *If you need a refill on your cardiac medications before your next appointment, please call your pharmacy*   Lab Work: Your physician recommends that you return for a FASTING lipid profile, LFTS, and A1C in July  If you have labs (blood work) drawn today and your tests are completely normal, you will receive your results only by: Marland Kitchen MyChart Message (if you have MyChart) OR . A paper copy in the mail If you have any lab test that is abnormal or we need to change your treatment, we will call you to review the results.   Testing/Procedures: None ordered   Follow-Up: At [facility], you and your health needs are our priority.  As part of our continuing mission to provide you with exceptional heart care, we have created designated Provider Care Teams.  These Care Teams include your primary Cardiologist (physician) and Advanced Practice Providers (APPs -  Physician Assistants and Nurse Practitioners) who all work together to provide you with the care you need, when you need it.  We recommend signing up for the patient portal called "MyChart".  Sign up information is provided on this After Visit Summary.  MyChart is used to connect with patients for Virtual Visits (Telemedicine).  Patients are able to view lab/test results, encounter notes, upcoming appointments, etc.  Non-urgent messages can be sent to your provider as well.   To learn more about what you can do with MyChart, go to [email or web address].    Your next appointment:   12 month(s)  The format for your next appointment:   In Person  Provider:   You may see Lance Muss, MD or one of the following Advanced Practice Providers on your designated Care Team:    Ronie Spies, PA-C  Jacolyn Reedy, PA-C    Other Instructions  Prediabetes Eating  Plan Prediabetes is a condition that causes blood sugar (glucose) levels to be higher than normal. This increases the risk for developing diabetes. In order to prevent diabetes from developing, your health care provider may recommend a diet and other lifestyle changes to help you:  Control your blood glucose levels.  Improve your cholesterol levels.  Manage your blood pressure. Your health care provider may recommend working with a diet and nutrition specialist (dietitian) to make a meal plan that is best for you. What are tips for following this plan? Lifestyle  Set weight loss goals with the help of your health care team. It is recommended that most people with prediabetes lose 7% of their current body weight.  Exercise for at least 30 minutes at least 5 days a week.  Attend a support group or seek ongoing support from a mental health counselor.  Take over-the-counter and prescription medicines only as told by your health care provider. Reading food labels  Read food labels to check the amount of fat, salt (sodium), and sugar in prepackaged foods. Avoid foods that have: ? Saturated fats. ? Trans fats. ? Added sugars.  Avoid foods that have more than 300 milligrams (mg) of sodium per serving. Limit your daily sodium intake to less than 2,300 mg each day. Shopping  Avoid buying pre-made and processed foods. Cooking  Cook with olive oil. Do not use butter, lard, or ghee.  Bake, broil, grill, or boil foods. Avoid frying. Meal planning   Work with your dietitian to develop an  eating plan that is right for you. This may include: ? Tracking how many calories you take in. Use a food diary, notebook, or mobile application to track what you eat at each meal. ? Using the glycemic index (GI) to plan your meals. The index tells you how quickly a food will raise your blood glucose. Choose low-GI foods. These foods take a longer time to raise blood glucose.  Consider following a  Mediterranean diet. This diet includes: ? Several servings each day of fresh fruits and vegetables. ? Eating fish at least twice a week. ? Several servings each day of whole grains, beans, nuts, and seeds. ? Using olive oil instead of other fats. ? Moderate alcohol consumption. ? Eating small amounts of red meat and whole-fat dairy.  If you have high blood pressure, you may need to limit your sodium intake or follow a diet such as the DASH eating plan. DASH is an eating plan that aims to lower high blood pressure. What foods are recommended? The items listed below may not be a complete list. Talk with your dietitian about what dietary choices are best for you. Grains Whole grains, such as whole-wheat or whole-grain breads, crackers, cereals, and pasta. Unsweetened oatmeal. Bulgur. Barley. Quinoa. Brown rice. Corn or whole-wheat flour tortillas or taco shells. Vegetables Lettuce. Spinach. Peas. Beets. Cauliflower. Cabbage. Broccoli. Carrots. Tomatoes. Squash. Eggplant. Herbs. Peppers. Onions. Cucumbers. Brussels sprouts. Fruits Berries. Bananas. Apples. Oranges. Grapes. Papaya. Mango. Pomegranate. Kiwi. Grapefruit. Cherries. Meats and other protein foods Seafood. Poultry without skin. Lean cuts of pork and beef. Tofu. Eggs. Nuts. Beans. Dairy Low-fat or fat-free dairy products, such as yogurt, cottage cheese, and cheese. Beverages Water. Tea. Coffee. Sugar-free or diet soda. Seltzer water. Lowfat or no-fat milk. Milk alternatives, such as soy or almond milk. Fats and oils Olive oil. Canola oil. Sunflower oil. Grapeseed oil. Avocado. Walnuts. Sweets and desserts Sugar-free or low-fat pudding. Sugar-free or low-fat ice cream and other frozen treats. Seasoning and other foods Herbs. Sodium-free spices. Mustard. Relish. Low-fat, low-sugar ketchup. Low-fat, low-sugar barbecue sauce. Low-fat or fat-free mayonnaise. What foods are not recommended? The items listed below may not be a complete  list. Talk with your dietitian about what dietary choices are best for you. Grains Refined white flour and flour products, such as bread, pasta, snack foods, and cereals. Vegetables Canned vegetables. Frozen vegetables with butter or cream sauce. Fruits Fruits canned with syrup. Meats and other protein foods Fatty cuts of meat. Poultry with skin. Breaded or fried meat. Processed meats. Dairy Full-fat yogurt, cheese, or milk. Beverages Sweetened drinks, such as sweet iced tea and soda. Fats and oils Butter. Lard. Ghee. Sweets and desserts Baked goods, such as cake, cupcakes, pastries, cookies, and cheesecake. Seasoning and other foods Spice mixes with added salt. Ketchup. Barbecue sauce. Mayonnaise. Summary  To prevent diabetes from developing, you may need to make diet and other lifestyle changes to help control blood sugar, improve cholesterol levels, and manage your blood pressure.  Set weight loss goals with the help of your health care team. It is recommended that most people with prediabetes lose 7 percent of their current body weight.  Consider following a Mediterranean diet that includes plenty of fresh fruits and vegetables, whole grains, beans, nuts, seeds, fish, lean meat, low-fat dairy, and healthy oils. This information is not intended to replace advice given to you by your health care provider. Make sure you discuss any questions you have with your health care provider. Document Revised: 10/09/2018 Document Reviewed:  08/21/2016 Elsevier Patient Education  [place].

## 2019-11-04 DIAGNOSIS — I10 Essential (primary) hypertension: Secondary | ICD-10-CM

## 2019-11-08 ENCOUNTER — Ambulatory Visit: Payer: BC Managed Care – PPO | Admitting: Internal Medicine

## 2019-11-08 MED ORDER — IRBESARTAN 150 MG PO TABS: 150.0000 mg | ORAL_TABLET | Freq: Every day | ORAL | 3 refills | Status: DC

## 2019-11-08 NOTE — Telephone Encounter (Signed)
Called and spoke to the patient and made him aware of recommendations to stop lisinopril and start irbesartan 150 mg QD. Patient will be going out of town but will return the end of next week. Patient will come in for BMET on 5/21. Patient will continue to monitor BP with medication change. Rx sent to preferred pharmacy.

## 2019-11-19 ENCOUNTER — Other Ambulatory Visit: Payer: BC Managed Care – PPO | Admitting: *Deleted

## 2019-11-19 ENCOUNTER — Other Ambulatory Visit: Payer: Self-pay

## 2019-11-19 DIAGNOSIS — I10 Essential (primary) hypertension: Secondary | ICD-10-CM

## 2019-11-19 LAB — BASIC METABOLIC PANEL
BUN/Creatinine Ratio: 9 (ref 9–20)
BUN: 11 mg/dL (ref 6–24)
CO2: 26 mmol/L (ref 20–29)
Calcium: 9.5 mg/dL (ref 8.7–10.2)
Chloride: 104 mmol/L (ref 96–106)
Creatinine, Ser: 1.18 mg/dL (ref 0.76–1.27)
GFR calc Af Amer: 80 mL/min/{1.73_m2} (ref >59)
GFR calc non Af Amer: 70 mL/min/{1.73_m2} (ref >59)
Glucose: 85 mg/dL (ref 65–99)
Potassium: 4.4 mmol/L (ref 3.5–5.2)
Sodium: 138 mmol/L (ref 134–144)

## 2019-11-30 NOTE — Telephone Encounter (Signed)
Left message for patient to call back  

## 2019-12-01 ENCOUNTER — Telehealth: Payer: Self-pay | Admitting: Interventional Cardiology

## 2019-12-01 NOTE — Telephone Encounter (Signed)
The patient is calling to make sure we have received his mychart message and I let him know that we did receive it and it has been sent over to Dr. Isabel Caprice and his RN. He thanked me for the call.

## 2019-12-01 NOTE — Telephone Encounter (Signed)
Patient returning call from yesterday in regards to his mychart message.

## 2019-12-03 MED ORDER — IRBESARTAN 150 MG PO TABS: 150.0000 mg | ORAL_TABLET | Freq: Two times a day (BID) | ORAL | 3 refills | Status: DC

## 2019-12-03 NOTE — Telephone Encounter (Signed)
Med list updated with patient taking irbesartan 150 mg BID.

## 2019-12-28 ENCOUNTER — Telehealth: Payer: Self-pay | Admitting: Interventional Cardiology

## 2019-12-28 NOTE — Telephone Encounter (Signed)
New Message   Patient is calling in to speak with Dr. Hoyle Barr nurse. Patient has some questions about his lab work and appointment he has scheduled in July. Please give patient a call back.

## 2019-12-28 NOTE — Telephone Encounter (Signed)
Called and spoke to patient. He states that he has an appointment on 7/15 with Dr. Okey Dupre, his PCP, who is going to be drawing labs as well. He states that he would like to cancel his lab appointment with [place] on 7/14 and have his PCP draw the LIPIDS, LFTS, and A1C that we were going to draw. He states that he will have them forward the results to [place]. Appointment with [place] has been cancelled and message forwarded to patient's PCP to make aware.

## 2019-12-30 ENCOUNTER — Encounter: Payer: BC Managed Care – PPO | Admitting: Internal Medicine

## 2020-01-07 ENCOUNTER — Telehealth: Payer: Self-pay

## 2020-01-07 MED FILL — IRBESARTAN 150 MG TAB: 150 | 30 days supply | Qty: 60 | Fill #0

## 2020-01-07 NOTE — Telephone Encounter (Signed)
**Note De-Identified Shaleah Nissley Obfuscation** I could not do an Irbesartan quantity exception through covermymeds so I called [employer] and s/w Shaquana who advised me that this PA is not needed if the pt were using [facility]. She states that because the pharmacy that is requesting the PA [facility] not [facility] they will need to contact their help desk for an override.  I called [facility] and advised Amber who states that she will take care of on her end.  She thanked me for calling her with update.

## 2020-01-07 NOTE — Telephone Encounter (Signed)
**Note De-Identified Abdinasir Spadafore Obfuscation** I started a Ibesartan PA through covermymeds and received the following message: [facility] Key: [id number]  Outcome  Your PA has been resolved, no additional PA is required.  Drug Irbesartan 150MG  tablets  Form [employer] Electronic PA Form (2017 NCPDP)  I [facility] and s/w Amber who advised me that they are receiving a message that a quantity exception is needed not a PA when they run the RX.  I advised her that I will attempt a Irbesartan quantity exception and will let them know the outcome when it becomes available.  She thanked me for calling them to discuss.

## 2020-01-12 ENCOUNTER — Other Ambulatory Visit: Payer: BC Managed Care – PPO

## 2020-01-12 MED FILL — ATORVASTATIN 20 MG TABLET: 20 | 90 days supply | Qty: 90 | Fill #1

## 2020-01-13 ENCOUNTER — Ambulatory Visit (INDEPENDENT_AMBULATORY_CARE_PROVIDER_SITE_OTHER): Payer: BC Managed Care – PPO | Admitting: Internal Medicine

## 2020-01-13 ENCOUNTER — Other Ambulatory Visit: Payer: Self-pay

## 2020-01-13 ENCOUNTER — Encounter: Payer: Self-pay | Admitting: Internal Medicine

## 2020-01-13 VITALS — BP 128/84 | HR 72 | Temp 98.2°F | Ht 64.0 in | Wt 141.0 lb

## 2020-01-13 DIAGNOSIS — R0789 Other chest pain: Secondary | ICD-10-CM

## 2020-01-13 DIAGNOSIS — Z23 Encounter for immunization: Secondary | ICD-10-CM | POA: Diagnosis not present

## 2020-01-13 DIAGNOSIS — I10 Essential (primary) hypertension: Secondary | ICD-10-CM | POA: Insufficient documentation

## 2020-01-13 DIAGNOSIS — Z0000 Encounter for general adult medical examination without abnormal findings: Secondary | ICD-10-CM

## 2020-01-13 DIAGNOSIS — E538 Deficiency of other specified B group vitamins: Secondary | ICD-10-CM | POA: Diagnosis not present

## 2020-01-13 DIAGNOSIS — E559 Vitamin D deficiency, unspecified: Secondary | ICD-10-CM | POA: Diagnosis not present

## 2020-01-13 NOTE — Assessment & Plan Note (Addendum)
Still present with rare high BP. Taking lipitor 20 mg daily and rechecking lipid panel today.

## 2020-01-13 NOTE — Assessment & Plan Note (Signed)
BP slightly high today, recheck better. He will monitor at home and call back in 2-3 weeks and if still persistently high can add agent as irbesartan is at maximum dosing.

## 2020-01-13 NOTE — Patient Instructions (Addendum)
Think about getting the shingles vaccine.   Let us know about blood pressure in 2-3 weeks if still high.  Health Maintenance, Male Adopting a healthy lifestyle and getting preventive care are important in promoting health and wellness. Ask your health care provider about:  The right schedule for you to have regular tests and exams.  Things you can do on your own to prevent diseases and keep yourself healthy. What should I know about diet, weight, and exercise? Eat a healthy diet   Eat a diet that includes plenty of vegetables, fruits, low-fat dairy products, and lean protein.  Do not eat a lot of foods that are high in solid fats, added sugars, or sodium. Maintain a healthy weight Body mass index (BMI) is a measurement that can be used to identify possible weight problems. It estimates body fat based on height and weight. Your health care provider can help determine your BMI and help you achieve or maintain a healthy weight. Get regular exercise Get regular exercise. This is one of the most important things you can do for your health. Most adults should:  Exercise for at least 150 minutes each week. The exercise should increase your heart rate and make you sweat (moderate-intensity exercise).  Do strengthening exercises at least twice a week. This is in addition to the moderate-intensity exercise.  Spend less time sitting. Even light physical activity can be beneficial. Watch cholesterol and blood lipids Have your blood tested for lipids and cholesterol at 54 years of age, then have this test every 5 years. You may need to have your cholesterol levels checked more often if:  Your lipid or cholesterol levels are high.  You are older than 54 years of age.  You are at high risk for heart disease. What should I know about cancer screening? Many types of cancers can be detected early and may often be prevented. Depending on your health history and family history, you may need to have  cancer screening at various ages. This may include screening for:  Colorectal cancer.  Prostate cancer.  Skin cancer.  Lung cancer. What should I know about heart disease, diabetes, and high blood pressure? Blood pressure and heart disease  High blood pressure causes heart disease and increases the risk of stroke. This is more likely to develop in people who have high blood pressure readings, are of African descent, or are overweight.  Talk with your health care provider about your target blood pressure readings.  Have your blood pressure checked: ? Every 3-5 years if you are 38-3 years of age. ? Every year if you are 64 years old or older.  If you are between the ages of 41 and 14 and are a current or former smoker, ask your health care provider if you should have a one-time screening for abdominal aortic aneurysm (AAA). Diabetes Have regular diabetes screenings. This checks your fasting blood sugar level. Have the screening done:  Once every three years after age 37 if you are at a normal weight and have a low risk for diabetes.  More often and at a younger age if you are overweight or have a high risk for diabetes. What should I know about preventing infection? Hepatitis B If you have a higher risk for hepatitis B, you should be screened for this virus. Talk with your health care provider to find out if you are at risk for hepatitis B infection. Hepatitis C Blood testing is recommended for:  Everyone born from [birth year] through  44.  Anyone with known risk factors for hepatitis C. Sexually transmitted infections (STIs)  You should be screened each year for STIs, including gonorrhea and chlamydia, if: ? You are sexually active and are younger than 54 years of age. ? You are older than 54 years of age and your health care provider tells you that you are at risk for this type of infection. ? Your sexual activity has changed since you were last screened, and you are at increased  risk for chlamydia or gonorrhea. Ask your health care provider if you are at risk.  Ask your health care provider about whether you are at high risk for HIV. Your health care provider may recommend a prescription medicine to help prevent HIV infection. If you choose to take medicine to prevent HIV, you should first get tested for HIV. You should then be tested every 3 months for as long as you are taking the medicine. Follow these instructions at home: Lifestyle  Do not use any products that contain nicotine or tobacco, such as cigarettes, e-cigarettes, and chewing tobacco. If you need help quitting, ask your health care provider.  Do not use street drugs.  Do not share needles.  Ask your health care provider for help if you need support or information about quitting drugs. Alcohol use  Do not drink alcohol if your health care provider tells you not to drink.  If you drink alcohol: ? Limit how much you have to 0-2 drinks a day. ? Be aware of how much alcohol is in your drink. In the [place] one drink equals one 12 oz bottle of beer (355 mL), one 5 oz glass of wine (148 mL), or one 1 oz glass of hard liquor (44 mL). General instructions  Schedule regular health, dental, and eye exams.  Stay current with your vaccines.  Tell your health care provider if: ? You often feel depressed. ? You have ever been abused or do not feel safe at home. Summary  Adopting a healthy lifestyle and getting preventive care are important in promoting health and wellness.  Follow your health care provider's instructions about healthy diet, exercising, and getting tested or screened for diseases.  Follow your health care provider's instructions on monitoring your cholesterol and blood pressure. This information is not intended to replace advice given to you by your health care provider. Make sure you discuss any questions you have with your health care provider. Document Revised: 06/10/2018 Document  Reviewed: 06/10/2018 Elsevier Patient Education  2020 [employer].

## 2020-01-13 NOTE — Progress Notes (Signed)
   Subjective:   Patient ID: Darius Velasquez, male    DOB: 11/06/1965, 54 y.o.   MRN: [id number]  HPI The patient is a 53 YO man coming in for physical.   PMH, FMH, social history reviewed and updated  Review of Systems  Constitutional: Negative.   HENT: Negative.   Eyes: Negative.   Respiratory: Negative for cough, chest tightness and shortness of breath.   Cardiovascular: Negative for chest pain, palpitations and leg swelling.  Gastrointestinal: Negative for abdominal distention, abdominal pain, constipation, diarrhea, nausea and vomiting.  Musculoskeletal: Negative.   Skin: Negative.   Neurological: Negative.   Psychiatric/Behavioral: Negative.     Objective:  Physical Exam Constitutional:      Appearance: He is well-developed.  HENT:     Head: Normocephalic and atraumatic.  Cardiovascular:     Rate and Rhythm: Normal rate and regular rhythm.  Pulmonary:     Effort: Pulmonary effort is normal. No respiratory distress.     Breath sounds: Normal breath sounds. No wheezing or rales.  Abdominal:     General: Bowel sounds are normal. There is no distension.     Palpations: Abdomen is soft.     Tenderness: There is no abdominal tenderness. There is no rebound.  Musculoskeletal:     Cervical back: Normal range of motion.  Skin:    General: Skin is warm and dry.  Neurological:     Mental Status: He is alert and oriented to person, place, and time.     Coordination: Coordination normal.     Vitals:   01/13/20 0822  BP: (!) 144/98  Pulse: 72  Temp: 98.2 F (36.8 C)  TempSrc: Oral  SpO2: 96%  Weight: 141 lb (64 kg)  Height: 5\' 4"  (1.626 m)    This visit occurred during the SARS-CoV-2 public health emergency.  Safety protocols were in place, including screening questions prior to the visit, additional usage of staff PPE, and extensive cleaning of exam room while observing appropriate contact time as indicated for disinfecting solutions.   Assessment & Plan:  Tdap  given at visit

## 2020-01-13 NOTE — Assessment & Plan Note (Signed)
Checking B12 level and adjust as needed.  

## 2020-01-13 NOTE — Assessment & Plan Note (Signed)
Recheck today and adjust as needed.

## 2020-01-13 NOTE — Assessment & Plan Note (Signed)
Flu shot yearly. Covid-19 up to date. Shingrix counseled. Tetanus given at visit. Colonoscopy up to date. Counseled about sun safety and mole surveillance. Counseled about the dangers of distracted driving. Given 10 year screening recommendations.

## 2020-01-14 LAB — CBC
HCT: 43.4 % (ref 38.5–50.0)
Hemoglobin: 13.9 g/dL (ref 13.2–17.1)
MCH: 26.9 pg — ABNORMAL LOW (ref 27.0–33.0)
MCHC: 32 g/dL (ref 32.0–36.0)
MCV: 84.1 fL (ref 80.0–100.0)
MPV: 13 fL — ABNORMAL HIGH (ref 7.5–12.5)
Platelets: 172 10*3/uL (ref 140–400)
RBC: 5.16 10*6/uL (ref 4.20–5.80)
RDW: 14.5 % (ref 11.0–15.0)
WBC: 4.8 10*3/uL (ref 3.8–10.8)

## 2020-01-14 LAB — COMPREHENSIVE METABOLIC PANEL
AG Ratio: 1.9 (calc) (ref 1.0–2.5)
ALT: 22 U/L (ref 9–46)
AST: 18 U/L (ref 10–35)
Albumin: 4.4 g/dL (ref 3.6–5.1)
Alkaline phosphatase (APISO): 67 U/L (ref 35–144)
BUN: 10 mg/dL (ref 7–25)
CO2: 26 mmol/L (ref 20–32)
Calcium: 9.6 mg/dL (ref 8.6–10.3)
Chloride: 106 mmol/L (ref 98–110)
Creat: 1.04 mg/dL (ref 0.70–1.33)
Globulin: 2.3 g/dL (calc) (ref 1.9–3.7)
Glucose, Bld: 93 mg/dL (ref 65–99)
Potassium: 4.3 mmol/L (ref 3.5–5.3)
Sodium: 140 mmol/L (ref 135–146)
Total Bilirubin: 1.2 mg/dL (ref 0.2–1.2)
Total Protein: 6.7 g/dL (ref 6.1–8.1)

## 2020-01-14 LAB — VITAMIN D 25 HYDROXY (VIT D DEFICIENCY, FRACTURES): Vit D, 25-Hydroxy: 35 ng/mL (ref 30–100)

## 2020-01-14 LAB — LIPID PANEL
Cholesterol: 108 mg/dL (ref <200)
HDL: 45 mg/dL (ref >=40)
LDL Cholesterol (Calc): 48 mg/dL (calc)
Non-HDL Cholesterol (Calc): 63 mg/dL (calc) (ref <130)
Total CHOL/HDL Ratio: 2.4 (calc) (ref <5.0)
Triglycerides: 72 mg/dL (ref <150)

## 2020-01-14 LAB — HEMOGLOBIN A1C
Hgb A1c MFr Bld: 5.6 % of total Hgb (ref <5.7)
Mean Plasma Glucose: 114 (calc)
eAG (mmol/L): 6.3 (calc)

## 2020-01-14 LAB — VITAMIN B12: Vitamin B-12: 945 pg/mL (ref 200–1100)

## 2020-01-17 ENCOUNTER — Other Ambulatory Visit: Payer: Self-pay | Admitting: Interventional Cardiology

## 2020-01-17 ENCOUNTER — Encounter: Payer: Self-pay | Admitting: Internal Medicine

## 2020-01-17 MED ORDER — IRBESARTAN 300 MG PO TABS: 300.0000 mg | ORAL_TABLET | Freq: Every day | ORAL | 3 refills | Status: DC

## 2020-01-17 NOTE — Telephone Encounter (Signed)
Patient's recent labs with PCP on 7/15 in Epic for your review.

## 2020-02-22 MED FILL — IRBESARTAN 300 MG TAB: 300 | 90 days supply | Qty: 90 | Fill #0

## 2020-03-14 ENCOUNTER — Encounter: Payer: Self-pay | Admitting: Internal Medicine

## 2020-04-24 MED ORDER — IRBESARTAN 300 MG PO TABS: 150.0000 mg | ORAL_TABLET | Freq: Two times a day (BID) | ORAL | 3 refills | Status: DC

## 2020-04-25 MED FILL — ATORVASTATIN CALCIUM 20 MG: 20 | 90 days supply | Qty: 90 | Fill #2

## 2020-05-02 ENCOUNTER — Other Ambulatory Visit: Payer: Self-pay | Admitting: Interventional Cardiology

## 2020-05-02 MED ORDER — AMLODIPINE BESYLATE 5 MG PO TABS: 5.0000 mg | ORAL_TABLET | Freq: Every day | ORAL | 3 refills | Status: DC

## 2020-05-02 MED FILL — AMLODIPINE BESYLATE 5 MG TA: 5 | 90 days supply | Qty: 90 | Fill #0

## 2020-05-02 NOTE — Addendum Note (Signed)
Addended by: Daleen Bo I on: 05/02/2020 03:59 PM   Modules accepted: Orders

## 2020-05-24 MED FILL — IRBESARTAN 300 MG TABS: 300 | 90 days supply | Qty: 90 | Fill #1

## 2020-07-02 ENCOUNTER — Encounter: Payer: Self-pay | Admitting: Internal Medicine

## 2020-07-04 ENCOUNTER — Telehealth: Payer: Self-pay

## 2020-07-04 NOTE — Telephone Encounter (Signed)
Attempted to add additional dx code to [facility] 01/13/20 on hold for .  Will try again later Changed pt insurance information to reflect current [employer] plan.   Pt message sent to keep them updated.

## 2020-07-10 NOTE — Telephone Encounter (Signed)
Updated wife on message from 07/04/20.  Will attempt to call [facility] today to add all dx codes & keep pt updated.  Wife verb understanding.

## 2020-07-10 NOTE — Telephone Encounter (Signed)
Using the prompts, the patient does not currently have a balance.  Will send mychart message notifying pt.

## 2020-07-10 NOTE — Telephone Encounter (Signed)
Error

## 2020-07-28 MED FILL — AMLODIPINE BESYLATE 5 MG TA: 5 | 90 days supply | Qty: 90 | Fill #1

## 2020-07-28 MED FILL — ATORVASTATIN CALCIUM 20 MG: 20 | 90 days supply | Qty: 90 | Fill #3

## 2020-09-25 MED FILL — IRBESARTAN 300 MG TABS: 300 | 90 days supply | Qty: 90 | Fill #2

## 2020-10-06 ENCOUNTER — Other Ambulatory Visit (HOSPITAL_COMMUNITY): Payer: Self-pay

## 2020-10-23 ENCOUNTER — Other Ambulatory Visit (INDEPENDENT_AMBULATORY_CARE_PROVIDER_SITE_OTHER): Payer: BC Managed Care – PPO

## 2020-10-23 ENCOUNTER — Other Ambulatory Visit: Payer: Self-pay

## 2020-10-23 ENCOUNTER — Ambulatory Visit (INDEPENDENT_AMBULATORY_CARE_PROVIDER_SITE_OTHER)
Admission: RE | Admit: 2020-10-23 | Discharge: 2020-10-23 | Disposition: A | Discharge status: Home / Self Care | Payer: BC Managed Care – PPO | Source: Ambulatory Visit | Attending: Internal Medicine | Admitting: Internal Medicine

## 2020-10-23 ENCOUNTER — Ambulatory Visit: Payer: BC Managed Care – PPO | Admitting: Internal Medicine

## 2020-10-23 ENCOUNTER — Encounter: Payer: Self-pay | Admitting: Internal Medicine

## 2020-10-23 VITALS — BP 130/80 | HR 73 | Temp 98.2°F | Resp 18 | Ht 64.0 in | Wt 142.0 lb

## 2020-10-23 DIAGNOSIS — E538 Deficiency of other specified B group vitamins: Secondary | ICD-10-CM

## 2020-10-23 DIAGNOSIS — E559 Vitamin D deficiency, unspecified: Secondary | ICD-10-CM

## 2020-10-23 DIAGNOSIS — R200 Anesthesia of skin: Secondary | ICD-10-CM | POA: Diagnosis not present

## 2020-10-23 DIAGNOSIS — R202 Paresthesia of skin: Secondary | ICD-10-CM

## 2020-10-23 NOTE — Patient Instructions (Addendum)
We will check the labs and the x-ray today to check on the numbness and tingling.

## 2020-10-23 NOTE — Progress Notes (Signed)
   Subjective:   Patient ID: Darius Velasquez, male    DOB: 1966/04/28, 55 y.o.   MRN: [id number]  HPI The patient is a 55 YO man coming in for new tingling and numbness in the fingers and toes. Started about a month or so ago. Overall is more consistent and slightly worsening. He thought it could be associated with the weather but it has not improved since the temps have increased. Some pain in the upper back going along with this. Denies neck pain specifically. He has some weakness in the arms as well with this. Has vitamin D and B12 deficiency and maintains supplements daily. Denies missing that lately. Denies change in diet, exercise, medications recently. Has not tried anything for it.   Review of Systems  Constitutional: Negative.   HENT: Negative.   Eyes: Negative.   Respiratory: Negative for cough, chest tightness and shortness of breath.   Cardiovascular: Negative for chest pain, palpitations and leg swelling.  Gastrointestinal: Negative for abdominal distention, abdominal pain, constipation, diarrhea, nausea and vomiting.  Musculoskeletal: Negative.   Skin: Negative.   Neurological: Positive for weakness and numbness.  Psychiatric/Behavioral: Negative.     Objective:  Physical Exam Constitutional:      Appearance: He is well-developed.  HENT:     Head: Normocephalic and atraumatic.  Cardiovascular:     Rate and Rhythm: Normal rate and regular rhythm.  Pulmonary:     Effort: Pulmonary effort is normal. No respiratory distress.     Breath sounds: Normal breath sounds. No wheezing or rales.  Abdominal:     General: Bowel sounds are normal. There is no distension.     Palpations: Abdomen is soft.     Tenderness: There is no abdominal tenderness. There is no rebound.  Musculoskeletal:     Cervical back: Normal range of motion.  Skin:    General: Skin is warm and dry.  Neurological:     Mental Status: He is alert and oriented to person, place, and time.     Cranial Nerves: No  cranial nerve deficit.     Coordination: Coordination normal.     Comments: Some tingling sensation in the fingertips during visit but intact to sensation, grip strength equal bilateral hands and arm muscles bilateral 4/4.     Vitals:   10/23/20 1557  BP: 130/80  Pulse: 73  Resp: 18  Temp: 98.2 F (36.8 C)  TempSrc: Oral  SpO2: 98%  Weight: 142 lb (64.4 kg)  Height: 5\' 4"  (1.626 m)    This visit occurred during the SARS-CoV-2 public health emergency.  Safety protocols were in place, including screening questions prior to the visit, additional usage of staff PPE, and extensive cleaning of exam room while observing appropriate contact time as indicated for disinfecting solutions.   Assessment & Plan:

## 2020-10-24 DIAGNOSIS — R200 Anesthesia of skin: Secondary | ICD-10-CM | POA: Insufficient documentation

## 2020-10-24 LAB — HEMOGLOBIN A1C: Hgb A1c MFr Bld: 6.1 % (ref 4.6–6.5)

## 2020-10-24 LAB — TSH: TSH: 4.89 u[IU]/mL — ABNORMAL HIGH (ref 0.35–4.50)

## 2020-10-24 LAB — VITAMIN B12: Vitamin B-12: 1170 pg/mL — ABNORMAL HIGH (ref 211–911)

## 2020-10-24 LAB — VITAMIN D 25 HYDROXY (VIT D DEFICIENCY, FRACTURES): VITD: 48.51 ng/mL (ref 30.00–100.00)

## 2020-10-24 NOTE — Assessment & Plan Note (Signed)
Checking vitamin D level, adjust as needed. Taking oral otc vitamin D.

## 2020-10-24 NOTE — Assessment & Plan Note (Signed)
Given the perceived weakness in the hands as well as the numbness checking HgA1c, thyroid, B12, vitamin D and cervical spine x-ray. Treat as appropriate based on results.

## 2020-10-24 NOTE — Assessment & Plan Note (Signed)
Checking B12 and is having new tingling/numbness. Adjust as needed. Taking oral B12 daily.

## 2020-10-25 ENCOUNTER — Telehealth: Payer: Self-pay | Admitting: Internal Medicine

## 2020-10-25 NOTE — Telephone Encounter (Signed)
See below

## 2020-10-25 NOTE — Telephone Encounter (Signed)
Patient called and said that he would like to see Dr. Katrinka Blazing in sports medicine. Please advice.

## 2020-10-26 NOTE — Telephone Encounter (Signed)
Please schedule, not sure if there is a question here.

## 2020-10-26 NOTE — Telephone Encounter (Signed)
Are you able to schedule this for the patient?

## 2020-10-27 NOTE — Progress Notes (Signed)
Subjective:    CC: B fingers and toes numbness/tingling  I, Molly Weber, LAT, ATC, am serving as scribe for Dr. Clementeen Graham.  HPI: Pt is a 55 y/o male presenting w/ B fingers and toes numbness/tingling, R>L ongoing since Feb that is progressively worsening.  Pt reports he has upper back and low back pain. Pt reports numbness/tingling and weakness in UE, especially along forearm and hands/fingers. Pt also has numbness in bilat toes. 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  Neck pain: No Weakness: yes in his arms Aggravating factors: laying down (esp at night), elevating arms Treatments tried: ice, heat  Diagnostic imaging: C-spine XR- 10/23/20  Pertinent review of Systems: no fever.  Does feel chills at times.  Feels vibrations especially when laying down at night.  Relevant historical information: HTN. Hx Vit B12 def now corrected.    Objective:    Vitals:   10/30/20 0804  BP: 119/76  Pulse: 78  SpO2: 98%   General: Well Developed, well nourished, and in no acute distress.   MSK: C-spine normal-appearing Normal cervical motion. Positive right-sided Spurling's test.  Patient has reproduction of paresthesias right arm with Spurling's test right side at C6 and C7 distribution Left side negative Upper extremity strength is intact bilaterally. Reflexes intact. Sensation is intact.  Negative Tinel's right elbow and wrist.  Negative Phalen's test right wrist.  L-spine normal-appearing Nontender midline. Normal lumbar motion. Lower extremity strength is intact.  Lab and Radiology Results  DG Cervical Spine Complete  Result Date: 10/24/2020 CLINICAL DATA:  Bilateral upper extremity paresthesias EXAM: CERVICAL SPINE - COMPLETE 4+ VIEW COMPARISON:  None. FINDINGS: There is no evidence of cervical spine fracture or prevertebral soft tissue swelling. Straightening of the cervical lordosis without static listhesis. Mild intervertebral disc height loss  most pronounced at C5-6 and C6-7. Oblique views reveal bony foraminal crowding bilaterally at C5-6 and on the right at C6-7. IMPRESSION: 1. No acute osseous abnormality. 2. Mild degenerative disc disease most pronounced at C5-6 and C6-7 with associated bony foraminal narrowing. Electronically Signed   By: Duanne Guess D.O.   On: 10/24/2020 14:53  I, Clementeen Graham, personally (independently) visualized and performed the interpretation of the images attached in this note.   Recent Results (from the past 2160 hour(s))  VITAMIN D 25 Hydroxy (Vit-D Deficiency, Fractures)     Status: None   Collection Time: 10/23/20  4:47 PM  Result Value Ref Range   VITD 48.51 30.00 - 100.00 ng/mL  Vitamin B12     Status: Abnormal   Collection Time: 10/23/20  4:47 PM  Result Value Ref Range   Vitamin B-12 1,170 (H) 211 - 911 pg/mL  TSH     Status: Abnormal   Collection Time: 10/23/20  4:47 PM  Result Value Ref Range   TSH 4.89 (H) 0.35 - 4.50 uIU/mL  Hemoglobin A1c     Status: None   Collection Time: 10/23/20  4:47 PM  Result Value Ref Range   Hgb A1c MFr Bld 6.1 4.6 - 6.5 %    Comment: Glycemic Control Guidelines for People with Diabetes:Non Diabetic:  <6%Goal of Therapy: <7%Additional Action Suggested:  >8%       Impression and Recommendations:    Assessment and Plan: 55 y.o. male with paresthesias of upper extremities and somewhat to the lower extremities right worse than left.  Patient also has a sensation of vibration across his entire body when he lays down  in bed at night. This will be somewhat difficult to evaluate and fully understand. However the most clear distinct problem today is right upper extremity paresthesias sensation.  After evaluation the most likely explanation for this is cervical radiculopathy at C6-C7 right side.  This does correspond to some DDD at C5-6 and C6-7 seen on x-ray obtained April 26.  However given his whiter distribution of symptoms there may be a metabolic process  going on as well.  His primary care provider did some metabolic work-up in late April which showed fortunately normal B12 and vitamin D levels.  His A1c is mildly elevated however his TSH is minimally elevated as well.  We will go ahead and expand on work-up and chase down the mildly elevated TSH.  We will check metabolic panel to check for electrolyte abnormality and check free T4 and T3.  I do not expect that his symptoms are due to a metabolic process but this is worth running down.  For now treat with physical therapy and gabapentin and reassess in about 6 weeks.  If not better consider MRI cervical spine to further evaluate right upper extremity paresthesia.  PDMP not reviewed this encounter. Orders Placed This Encounter  Procedures  . Comprehensive metabolic panel    Standing Status:   Future    Number of Occurrences:   1    Standing Expiration Date:   10/30/2021  . T4, free    Standing Status:   Future    Number of Occurrences:   1    Standing Expiration Date:   10/30/2021  . T3    Standing Status:   Future    Number of Occurrences:   1    Standing Expiration Date:   10/30/2021  . Ambulatory referral to Physical Therapy    Referral Priority:   Routine    Referral Type:   Physical Medicine    Referral Reason:   Specialty Services Required    Requested Specialty:   Physical Therapy   Meds ordered this encounter  Medications  . gabapentin (NEURONTIN) 300 MG capsule    Sig: Take 1 capsule (300 mg total) by mouth 3 (three) times daily as needed.    Dispense:  90 capsule    Refill:  3    Discussed warning signs or symptoms. Please see discharge instructions. Patient expresses understanding.   The above documentation has been reviewed and is accurate and complete Clementeen Graham, M.D.

## 2020-10-30 ENCOUNTER — Ambulatory Visit: Payer: BC Managed Care – PPO | Admitting: Family Medicine

## 2020-10-30 ENCOUNTER — Other Ambulatory Visit (HOSPITAL_COMMUNITY): Payer: Self-pay

## 2020-10-30 ENCOUNTER — Other Ambulatory Visit (INDEPENDENT_AMBULATORY_CARE_PROVIDER_SITE_OTHER): Payer: BC Managed Care – PPO

## 2020-10-30 ENCOUNTER — Encounter: Payer: Self-pay | Admitting: Family Medicine

## 2020-10-30 ENCOUNTER — Other Ambulatory Visit: Payer: Self-pay

## 2020-10-30 VITALS — BP 119/76 | HR 78 | Ht 64.0 in | Wt 143.4 lb

## 2020-10-30 DIAGNOSIS — M545 Low back pain, unspecified: Secondary | ICD-10-CM | POA: Diagnosis not present

## 2020-10-30 DIAGNOSIS — R202 Paresthesia of skin: Secondary | ICD-10-CM | POA: Diagnosis not present

## 2020-10-30 DIAGNOSIS — R7989 Other specified abnormal findings of blood chemistry: Secondary | ICD-10-CM | POA: Diagnosis not present

## 2020-10-30 DIAGNOSIS — M5412 Radiculopathy, cervical region: Secondary | ICD-10-CM

## 2020-10-30 DIAGNOSIS — G8929 Other chronic pain: Secondary | ICD-10-CM | POA: Diagnosis not present

## 2020-10-30 LAB — COMPREHENSIVE METABOLIC PANEL
ALT: 20 U/L (ref 0–53)
AST: 18 U/L (ref 0–37)
Albumin: 4.3 g/dL (ref 3.5–5.2)
Alkaline Phosphatase: 63 U/L (ref 39–117)
BUN: 14 mg/dL (ref 6–23)
CO2: 24 mEq/L (ref 19–32)
Calcium: 9.3 mg/dL (ref 8.4–10.5)
Chloride: 107 mEq/L (ref 96–112)
Creatinine, Ser: 1.04 mg/dL (ref 0.40–1.50)
GFR: 80.96 mL/min (ref >60.00)
Glucose, Bld: 96 mg/dL (ref 70–99)
Potassium: 3.9 mEq/L (ref 3.5–5.1)
Sodium: 138 mEq/L (ref 135–145)
Total Bilirubin: 1.3 mg/dL — ABNORMAL HIGH (ref 0.2–1.2)
Total Protein: 6.9 g/dL (ref 6.0–8.3)

## 2020-10-30 LAB — T3: T3, Total: 67 ng/dL — ABNORMAL LOW (ref 76–181)

## 2020-10-30 LAB — T4, FREE: Free T4: 0.76 ng/dL (ref 0.60–1.60)

## 2020-10-30 MED ORDER — GABAPENTIN 300 MG PO CAPS
300.0000 mg | ORAL_CAPSULE | Freq: Three times a day (TID) | ORAL | 3 refills | Status: AC | PRN
  Filled 2020-10-30: qty 90, 30d supply, fill #0
  Filled 2021-07-31 – 2021-10-12 (×2): qty 90, 30d supply, fill #1

## 2020-10-30 NOTE — Progress Notes (Signed)
Cardiology Office Note   Date:  10/31/2020   ID:  Darius Velasquez, DOB [birth year], MRN [id number]  PCP:  Myrlene Broker, MD    No chief complaint on file.  Hypertension/coronary artery calcification  Wt Readings from Last 3 Encounters:  10/31/20 143 lb 6.4 oz (65 kg)  10/30/20 143 lb 6.4 oz (65 kg)  10/23/20 142 lb (64.4 kg)       History of Present Illness: Darius Velasquez is a 55 y.o. male  Who had chest pain in 3/21.  Bp had been variable. It was anywhere from 110-180. With the higher BPs, he can have some chest discomfort.   He does yoga and regular walking. He can go for 2 miles and has no problems. He has had some chest pain when his BP is increased.   Older brother with HTN.   4/21 Coronary CT showed: "1. Coronary calcium score of 89. This was 95 percentile for age and sex matched control.  2. Normal coronary origin with right dominance.  3. LAD is a large vessel that has proximal focal calcified plaque with 0-24% stenosis. There is a focal stenosis 25-49% at the junction of mid to distal LAD (just after the bifurcation of the second diagonal). Will send for CT-FFR analysis to evaluate flow.  4. Distal aortic atherosclerosis."  FFR was negative. : "IMPRESSION: LAD - proximal 0.98, mid 0.95, distal (after second diagonal) 0.83. FFR <0.8 is considered abnormal. Distal LAD lesion is moderate.  LM, RCA, Circ are normal."  Blood pressure control has required multiple medications.  BP has been controlled.  Typically 120-130 systolic.    Has had right shoulder pain radiating to the right hand. He will do physical therapy.   Walks regularly without sx.  Denies : Chest pain. Dizziness. Leg edema. Nitroglycerin use. Orthopnea. Palpitations. Paroxysmal nocturnal dyspnea. Shortness of breath. Syncope.     Past Medical History:  Diagnosis Date  . Acute upper respiratory infection 02/06/2016  . Atypical chest pain 09/07/2019  . B12 deficiency  09/07/2019  . Blood in stool 01/03/2015  . Cold intolerance 11/09/2018  . Fatigue 09/07/2019  . High blood pressure   . Right foot pain 01/03/2015  . Vitamin D deficiency 09/07/2019    No past surgical history on file.   Current Outpatient Medications  Medication Sig Dispense Refill  . amLODipine (NORVASC) 5 MG tablet TAKE 1 TABLET BY MOUTH ONCE DAILY 90 tablet 3  . atorvastatin (LIPITOR) 20 MG tablet TAKE 1 TABLET BY MOUTH ONCE DAILY 90 tablet 3  . gabapentin (NEURONTIN) 300 MG capsule Take 1 capsule (300 mg total) by mouth 3 (three) times daily as needed. 90 capsule 3  . irbesartan (AVAPRO) 300 MG tablet Take 0.5 tablets (150 mg total) by mouth in the morning and at bedtime. 90 tablet 3  . irbesartan (AVAPRO) 300 MG tablet TAKE 1 TABLET BY MOUTH DAILY 90 tablet 3  . Omega 3 1000 MG CAPS Take 1 capsule by mouth daily.    . vitamin B-12 (CYANOCOBALAMIN) 1000 MCG tablet Take 1 tablet (1,000 mcg total) by mouth daily. 90 tablet 3  . Vitamin D, Ergocalciferol, (DRISDOL) 1.25 MG (50000 UT) CAPS capsule Take 1 capsule (50,000 Units total) by mouth every 7 (seven) days. 12 capsule 0   No current facility-administered medications for this visit.    Allergies:   Patient has no known allergies.    Social History:  The patient  reports that he has never smoked. He has never  used smokeless tobacco. He reports current alcohol use. He reports that he does not use drugs.   Family History:  The patient's family history includes High Cholesterol in his mother.    ROS:  Please see the history of present illness.   Otherwise, review of systems are positive for shoulder pain.   All other systems are reviewed and negative.    PHYSICAL EXAM: VS:  BP 122/80   Pulse 64   Ht 5\' 4"  (1.626 m)   Wt 143 lb 6.4 oz (65 kg)   SpO2 98%   BMI 24.61 kg/m  , BMI Body mass index is 24.61 kg/m. GEN: Well nourished, well developed, in no acute distress  HEENT: normal  Neck: no JVD, carotid bruits, or masses Cardiac:  RRR; no murmurs, rubs, or gallops,no edema  Respiratory:  clear to auscultation bilaterally, normal work of breathing GI: soft, nontender, nondistended, + BS MS: no deformity or atrophy  Skin: warm and dry, no rash Neuro:  Strength and sensation are intact Psych: euthymic mood, full affect   EKG:   The ekg ordered today demonstrates NSR, no ST changes   Recent Labs: 01/13/2020: Hemoglobin 13.9; Platelets 172 10/23/2020: TSH 4.89 10/30/2020: ALT 20; BUN 14; Creatinine, Ser 1.04; Potassium 3.9; Sodium 138   Lipid Panel    Component Value Date/Time   CHOL 108 01/13/2020 0905   TRIG 72 01/13/2020 0905   HDL 45 01/13/2020 0905   CHOLHDL 2.4 01/13/2020 0905   VLDL 36.4 09/07/2019 1403   LDLCALC 48 01/13/2020 0905   LDLDIRECT 133.0 11/08/2016 1409     Other studies Reviewed: Additional studies/ records that were reviewed today with results demonstrating: labs reviewed.     ASSESSMENT AND PLAN:  1. Coronary artery calcification: No ischemia by CT FFR.  Continue aggressive secondary prevention.  No issues with regular exercise.  He knows exercise is an important habit for him to maintain.  2. Hypertension: Low-salt diet.  Avoid processed foods.  Well controlled on the current meds, amlodipine 5 mg daily and irbesartan 150 mg BID. 3. Hyperlipidemia: Given coronary calcium, statin was started.  Would like to see LDL less than 100.  Mother with high cholesterol.  LDL 48 in 2021. COntinue atorvastatin.  4. Aortic atherosclerosis: Continue with aggressive lipid management.  Whole food, plant-based diet.   5. Prediabetes: Increase fiber in diet.  Avoid processed foods.  Maintain regular exercise.   He prefers to f/u with cardiology on a prn basis.  He will have his PMD prescribe his meds and let 2022 know if he has any problems.   Current medicines are reviewed at length with the patient today.  The patient concerns regarding his medicines were addressed.  The following changes have been  made:  No change  Labs/ tests ordered today include:  No orders of the defined types were placed in this encounter.   Recommend 150 minutes/week of aerobic exercise Low fat, low carb, high fiber diet recommended  Disposition:   FU in 1 year   Signed, [place], MD  10/31/2020 4:42 PM    [facility] [address] [place], [place], [place]  [place] Phone: [phone]; Fax: [phone]

## 2020-10-30 NOTE — Progress Notes (Signed)
Labs so far look good.  No significant electrolyte problem.  Free T4 is normal.  T3 is still pending

## 2020-10-30 NOTE — Patient Instructions (Addendum)
Thank you for coming in today.  Please get labs today before you leave  I've referred you to Physical Therapy.  Let us know if you don't hear from them in one week.  Try the gabapentin at bedtime.   Recheck with me in about 6 weeks.    Cervical Radiculopathy  Cervical radiculopathy means that a nerve in the neck (a cervical nerve) is pinched or bruised. This can happen because of an injury to the cervical spine (vertebrae) in the neck, or as a normal part of getting older. This can cause pain or loss of feeling (numbness) that runs from your neck all the way down to your arm and fingers. Often, this condition gets better with rest. Treatment may be needed if the condition does not get better. What are the causes?  A neck injury.  A bulging disk in your spine.  Muscle movements that you cannot control (muscle spasms).  Tight muscles in your neck due to overuse.  Arthritis.  Breakdown in the bones and joints of the spine (spondylosis) due to getting older.  Bone spurs that form near the nerves in the neck. What are the signs or symptoms?  Pain. The pain may: ? Run from the neck to the arm and hand. ? Be very bad or irritating. ? Be worse when you move your neck.  Loss of feeling or tingling in your arm or hand.  Weakness in your arm or hand, in very bad cases. How is this treated? In many cases, treatment is not needed for this condition. With rest, the condition often gets better over time. If treatment is needed, options may include:  Wearing a soft neck collar (cervical collar) for short periods of time, as told by your doctor.  Doing exercises (physical therapy) to strengthen your neck muscles.  Taking medicines.  Having shots (injections) in your spine, in very bad cases.  Having surgery. This may be needed if other treatments do not help. The type of surgery that is used depends on the cause of your condition. Follow these instructions at home: If you have a  soft neck collar:  Wear it as told by your doctor. Remove it only as told by your doctor.  Ask your doctor if you can remove the collar for cleaning and bathing. If you are allowed to remove the collar for cleaning or bathing: ? Follow instructions from your doctor about how to remove the collar safely. ? Clean the collar by wiping it with mild soap and water and drying it completely. ? Take out any removable pads in the collar every 1-2 days. Wash them by hand with soap and water. Let them air-dry completely before you put them back in the collar. ? Check your skin under the collar for redness or sores. If you see any, tell your doctor. Managing pain  Take over-the-counter and prescription medicines only as told by your doctor.  If told, put ice on the painful area. ? If you have a soft neck collar, remove it as told by your doctor. ? Put ice in a plastic bag. ? Place a towel between your skin and the bag. ? Leave the ice on for 20 minutes, 2-3 times a day.  If using ice does not help, you can try using heat. Use the heat source that your doctor recommends, such as a moist heat pack or a heating pad. ? Place a towel between your skin and the heat source. ? Leave the heat on for  20-30 minutes. ? Remove the heat if your skin turns bright red. This is very important if you are unable to feel pain, heat, or cold. You may have a greater risk of getting burned.  You may try a gentle neck and shoulder rub (massage).      Activity  Rest as needed.  Return to your normal activities as told by your doctor. Ask your doctor what activities are safe for you.  Do exercises as told by your doctor or physical therapist.  Do not lift anything that is heavier than 10 lb (4.5 kg) until your doctor tells you that it is safe. General instructions  Use a flat pillow when you sleep.  Do not drive while wearing a soft neck collar. If you do not have a soft neck collar, ask your doctor if it is safe  to drive while your neck heals.  Ask your doctor if the medicine prescribed to you requires you to avoid driving or using heavy machinery.  Do not use any products that contain nicotine or tobacco, such as cigarettes, e-cigarettes, and chewing tobacco. These can delay healing. If you need help quitting, ask your doctor.  Keep all follow-up visits as told by your doctor. This is important. Contact a doctor if:  Your condition does not get better with treatment. Get help right away if:  Your pain gets worse and is not helped with medicine.  You lose feeling or feel weak in your hand, arm, face, or leg.  You have a high fever.  You have a stiff neck.  You cannot control when you poop or pee (have incontinence).  You have trouble with walking, balance, or talking. Summary  Cervical radiculopathy means that a nerve in the neck is pinched or bruised.  A nerve can get pinched from a bulging disk, arthritis, an injury to the neck, or other causes.  Symptoms include pain, tingling, or loss of feeling that goes from the neck into the arm or hand.  Weakness in your arm or hand can happen in very bad cases.  Treatment may include resting, wearing a soft neck collar, and doing exercises. You might need to take medicines for pain. In very bad cases, shots or surgery may be needed. This information is not intended to replace advice given to you by your health care provider. Make sure you discuss any questions you have with your health care provider. Document Revised: 05/08/2018 Document Reviewed: 05/08/2018 Elsevier Patient Education  2021 [employer].

## 2020-10-31 ENCOUNTER — Ambulatory Visit: Payer: BC Managed Care – PPO | Admitting: Interventional Cardiology

## 2020-10-31 ENCOUNTER — Other Ambulatory Visit: Payer: Self-pay

## 2020-10-31 ENCOUNTER — Encounter: Payer: Self-pay | Admitting: Interventional Cardiology

## 2020-10-31 VITALS — BP 122/80 | HR 64 | Ht 64.0 in | Wt 143.4 lb

## 2020-10-31 DIAGNOSIS — I700 Atherosclerosis of aorta: Secondary | ICD-10-CM

## 2020-10-31 DIAGNOSIS — R7303 Prediabetes: Secondary | ICD-10-CM

## 2020-10-31 DIAGNOSIS — E782 Mixed hyperlipidemia: Secondary | ICD-10-CM

## 2020-10-31 DIAGNOSIS — I2584 Coronary atherosclerosis due to calcified coronary lesion: Secondary | ICD-10-CM

## 2020-10-31 DIAGNOSIS — I2510 Atherosclerotic heart disease of native coronary artery without angina pectoris: Secondary | ICD-10-CM | POA: Diagnosis not present

## 2020-10-31 DIAGNOSIS — I10 Essential (primary) hypertension: Secondary | ICD-10-CM | POA: Diagnosis not present

## 2020-10-31 NOTE — Progress Notes (Signed)
I spoke with your primary care provider about the thyroid results.  I think were probably okay and likely this is just variable changes throughout the day.  We may want to consider more labs on recheck in a month but I do not think that causes of your symptoms are because of the labs results that we see here today.

## 2020-10-31 NOTE — Patient Instructions (Addendum)
Medication Instructions:  Your physician recommends that you continue on your current medications as directed. Please refer to the Current Medication list given to you today.  *If you need a refill on your cardiac medications before your next appointment, please call your pharmacy*   Lab Work: none If you have labs (blood work) drawn today and your tests are completely normal, you will receive your results only by: Marland Kitchen MyChart Message (if you have MyChart) OR . A paper copy in the mail If you have any lab test that is abnormal or we need to change your treatment, we will call you to review the results.   Testing/Procedures: none   Follow-Up: At [facility], you and your health needs are our priority.  As part of our continuing mission to provide you with exceptional heart care, we have created designated Provider Care Teams.  These Care Teams include your primary Cardiologist (physician) and Advanced Practice Providers (APPs -  Physician Assistants and Nurse Practitioners) who all work together to provide you with the care you need, when you need it.  We recommend signing up for the patient portal called "MyChart".  Sign up information is provided on this After Visit Summary.  MyChart is used to connect with patients for Virtual Visits (Telemedicine).  Patients are able to view lab/test results, encounter notes, upcoming appointments, etc.  Non-urgent messages can be sent to your provider as well.   To learn more about what you can do with MyChart, go to [email or web address].    Your next appointment:   As needed The format for your next appointment:   In Person  Provider:   You may see Lance Muss, MD or one of the following Advanced Practice Providers on your designated Care Team:    Ronie Spies, PA-C  Jacolyn Reedy, PA-C    Other Instructions  High-Fiber Eating Plan Fiber, also called dietary fiber, is a type of carbohydrate. It is found foods such as fruits,  vegetables, whole grains, and beans. A high-fiber diet can have many health benefits. Your health care provider may recommend a high-fiber diet to help:  Prevent constipation. Fiber can make your bowel movements more regular.  Lower your cholesterol.  Relieve the following conditions: ? Inflammation of veins in the anus (hemorrhoids). ? Inflammation of specific areas of the digestive tract (uncomplicated diverticulosis). ? A problem of the large intestine, also called the colon, that sometimes causes pain and diarrhea (irritable bowel syndrome, or IBS).  Prevent overeating as part of a weight-loss plan.  Prevent heart disease, type 2 diabetes, and certain cancers. What are tips for following this plan? Reading food labels  Check the nutrition facts label on food products for the amount of dietary fiber. Choose foods that have 5 grams of fiber or more per serving.  The goals for recommended daily fiber intake include: ? Men (age 52 or younger): 34-38 g. ? Men (over age 62): 28-34 g. ? Women (age 76 or younger): 25-28 g. ? Women (over age 60): 22-25 g. Your daily fiber goal is _____________ g.   Shopping  Choose whole fruits and vegetables instead of processed forms, such as apple juice or applesauce.  Choose a wide variety of high-fiber foods such as avocados, lentils, oats, and kidney beans.  Read the nutrition facts label of the foods you choose. Be aware of foods with added fiber. These foods often have high sugar and sodium amounts per serving. Cooking  Use whole-grain flour for baking and cooking.  Adriana Simas  with brown rice instead of white rice. Meal planning  Start the day with a breakfast that is high in fiber, such as a cereal that contains 5 g of fiber or more per serving.  Eat breads and cereals that are made with whole-grain flour instead of refined flour or white flour.  Eat brown rice, bulgur wheat, or millet instead of white rice.  Use beans in place of meat in  soups, salads, and pasta dishes.  Be sure that half of the grains you eat each day are whole grains. General information  You can get the recommended daily intake of dietary fiber by: ? Eating a variety of fruits, vegetables, grains, nuts, and beans. ? Taking a fiber supplement if you are not able to take in enough fiber in your diet. It is better to get fiber through food than from a supplement.  Gradually increase how much fiber you consume. If you increase your intake of dietary fiber too quickly, you may have bloating, cramping, or gas.  Drink plenty of water to help you digest fiber.  Choose high-fiber snacks, such as berries, raw vegetables, nuts, and popcorn. What foods should I eat? Fruits Berries. Pears. Apples. Oranges. Avocado. Prunes and raisins. Dried figs. Vegetables Sweet potatoes. Spinach. Kale. Artichokes. Cabbage. Broccoli. Cauliflower. Green peas. Carrots. Squash. Grains Whole-grain breads. Multigrain cereal. Oats and oatmeal. Brown rice. Barley. Bulgur wheat. Pitkin. Quinoa. Bran muffins. Popcorn. Rye wafer crackers. Meats and other proteins Navy beans, kidney beans, and pinto beans. Soybeans. Split peas. Lentils. Nuts and seeds. Dairy Fiber-fortified yogurt. Beverages Fiber-fortified soy milk. Fiber-fortified orange juice. Other foods Fiber bars. The items listed above may not be a complete list of recommended foods and beverages. Contact a dietitian for more information. What foods should I avoid? Fruits Fruit juice. Cooked, strained fruit. Vegetables Fried potatoes. Canned vegetables. Well-cooked vegetables. Grains White bread. Pasta made with refined flour. White rice. Meats and other proteins Fatty cuts of meat. Fried chicken or fried fish. Dairy Milk. Yogurt. Cream cheese. Sour cream. Fats and oils Butters. Beverages Soft drinks. Other foods Cakes and pastries. The items listed above may not be a complete list of foods and beverages to avoid.  Talk with your dietitian about what choices are best for you. Summary  Fiber is a type of carbohydrate. It is found in foods such as fruits, vegetables, whole grains, and beans.  A high-fiber diet has many benefits. It can help to prevent constipation, lower blood cholesterol, aid weight loss, and reduce your risk of heart disease, diabetes, and certain cancers.  Increase your intake of fiber gradually. Increasing fiber too quickly may cause cramping, bloating, and gas. Drink plenty of water while you increase the amount of fiber you consume.  The best sources of fiber include whole fruits and vegetables, whole grains, nuts, seeds, and beans. This information is not intended to replace advice given to you by your health care provider. Make sure you discuss any questions you have with your health care provider. Document Revised: 10/21/2019 Document Reviewed: 10/21/2019 Elsevier Patient Education  2021 [employer].

## 2020-10-31 NOTE — Progress Notes (Signed)
T3 is slightly low.  Again this is a very mildly low value outside the normal range.  I will refer the matter back to her primary care provider but we may want to recheck these when I see you again next month before taking action on them.

## 2020-11-01 ENCOUNTER — Encounter: Payer: Self-pay | Admitting: Family Medicine

## 2020-11-01 DIAGNOSIS — R202 Paresthesia of skin: Secondary | ICD-10-CM

## 2020-11-01 DIAGNOSIS — G8929 Other chronic pain: Secondary | ICD-10-CM

## 2020-11-01 DIAGNOSIS — M5412 Radiculopathy, cervical region: Secondary | ICD-10-CM

## 2020-11-01 DIAGNOSIS — M545 Low back pain, unspecified: Secondary | ICD-10-CM

## 2020-11-02 ENCOUNTER — Other Ambulatory Visit (HOSPITAL_COMMUNITY): Payer: Self-pay

## 2020-11-07 ENCOUNTER — Ambulatory Visit: Payer: BC Managed Care – PPO | Admitting: Rehabilitative and Restorative Service Providers"

## 2020-11-15 ENCOUNTER — Other Ambulatory Visit: Payer: Self-pay | Admitting: Interventional Cardiology

## 2020-11-15 ENCOUNTER — Other Ambulatory Visit (HOSPITAL_COMMUNITY): Payer: Self-pay

## 2020-11-15 MED ORDER — ATORVASTATIN CALCIUM 20 MG PO TABS
ORAL_TABLET | Freq: Every day | ORAL | 3 refills | Status: AC
  Filled 2020-11-15: qty 90, 90d supply, fill #0
  Filled 2021-03-09: qty 90, 90d supply, fill #1
  Filled 2021-05-25: qty 90, 90d supply, fill #2
  Filled 2021-07-31: qty 90, 90d supply, fill #3

## 2020-11-15 MED FILL — Amlodipine Besylate Tab 5 MG (Base Equivalent): ORAL | 90 days supply | Qty: 90 | Fill #0 | Status: AC

## 2020-11-16 ENCOUNTER — Other Ambulatory Visit (HOSPITAL_COMMUNITY): Payer: Self-pay

## 2020-12-04 ENCOUNTER — Encounter: Payer: Self-pay | Admitting: Internal Medicine

## 2020-12-11 ENCOUNTER — Ambulatory Visit: Payer: BC Managed Care – PPO | Admitting: Family Medicine

## 2020-12-15 NOTE — Telephone Encounter (Signed)
6.17.22 response to the patient:  I wanted to follow-up our conversation today in regards to your [facility] bill. We are changing the diagnosis codes to the following for these labs:  B12     E53.8 Vitamin D   E55.9  While this is not an automatic guarantee of payment, it has been submitted to [facility] for re-processing. I am told that it takes 2-3 business days to hear from [facility]. I am not sure how much longer it will take the insurance company to respond.  However, we will keep you informed as much as possible.  Per [facility] I sent to billing just now ?? It usually takes billing 2-3 days to respond to me.  I will forward to you once I get that. Thank you, Viki

## 2021-01-15 ENCOUNTER — Encounter: Payer: BC Managed Care – PPO | Admitting: Internal Medicine

## 2021-01-22 ENCOUNTER — Encounter: Payer: Self-pay | Admitting: Internal Medicine

## 2021-01-26 ENCOUNTER — Other Ambulatory Visit (HOSPITAL_COMMUNITY): Payer: Self-pay

## 2021-01-26 ENCOUNTER — Ambulatory Visit (INDEPENDENT_AMBULATORY_CARE_PROVIDER_SITE_OTHER): Payer: BC Managed Care – PPO | Admitting: Internal Medicine

## 2021-01-26 ENCOUNTER — Other Ambulatory Visit: Payer: Self-pay

## 2021-01-26 ENCOUNTER — Encounter: Payer: Self-pay | Admitting: Internal Medicine

## 2021-01-26 VITALS — BP 118/72 | HR 73 | Temp 98.4°F | Resp 18 | Ht 64.0 in | Wt 139.4 lb

## 2021-01-26 DIAGNOSIS — I10 Essential (primary) hypertension: Secondary | ICD-10-CM | POA: Diagnosis not present

## 2021-01-26 DIAGNOSIS — Z0000 Encounter for general adult medical examination without abnormal findings: Secondary | ICD-10-CM

## 2021-01-26 DIAGNOSIS — R7303 Prediabetes: Secondary | ICD-10-CM | POA: Diagnosis not present

## 2021-01-26 DIAGNOSIS — E538 Deficiency of other specified B group vitamins: Secondary | ICD-10-CM | POA: Diagnosis not present

## 2021-01-26 DIAGNOSIS — E559 Vitamin D deficiency, unspecified: Secondary | ICD-10-CM

## 2021-01-26 LAB — LIPID PANEL
Cholesterol: 114 mg/dL (ref 0–200)
HDL: 44.7 mg/dL (ref >39.00)
LDL Cholesterol: 54 mg/dL (ref 0–99)
NonHDL: 69.64
Total CHOL/HDL Ratio: 3
Triglycerides: 77 mg/dL (ref 0.0–149.0)
VLDL: 15.4 mg/dL (ref 0.0–40.0)

## 2021-01-26 LAB — HEMOGLOBIN A1C: Hgb A1c MFr Bld: 6 % (ref 4.6–6.5)

## 2021-01-26 MED ORDER — AMLODIPINE BESYLATE 5 MG PO TABS
ORAL_TABLET | Freq: Every day | ORAL | 3 refills | Status: DC
  Filled 2021-01-26: qty 90, 90d supply, fill #0
  Filled 2021-05-25: qty 90, 90d supply, fill #1
  Filled 2021-07-31: qty 90, 90d supply, fill #2
  Filled 2021-11-20: qty 90, 90d supply, fill #3

## 2021-01-26 MED ORDER — IRBESARTAN 300 MG PO TABS
150.0000 mg | ORAL_TABLET | Freq: Two times a day (BID) | ORAL | 3 refills | Status: AC
  Filled 2021-01-26: qty 90, 90d supply, fill #0
  Filled 2021-05-25: qty 90, 90d supply, fill #1
  Filled 2021-07-31: qty 90, 90d supply, fill #2

## 2021-01-26 NOTE — Assessment & Plan Note (Signed)
Flu shot yearly. Covid-19 2 shots counseled about booster. Shingrix counseled declines. Tetanus up to date. Colonoscopy up to date. Counseled about sun safety and mole surveillance. Counseled about the dangers of distracted driving. Given 10 year screening recommendations.

## 2021-01-26 NOTE — Progress Notes (Signed)
   Subjective:   Patient ID: Darius Velasquez, male    DOB: 07/01/1966, 55 y.o.   MRN: [id number]  HPI The patient is a 55 YO man coming in for physical.   PMH, FMH, social history reviewed and updated  Review of Systems  Constitutional: Negative.   HENT: Negative.    Eyes: Negative.   Respiratory:  Negative for cough, chest tightness and shortness of breath.   Cardiovascular:  Negative for chest pain, palpitations and leg swelling.  Gastrointestinal:  Negative for abdominal distention, abdominal pain, constipation, diarrhea, nausea and vomiting.  Musculoskeletal: Negative.   Skin: Negative.   Neurological:  Positive for numbness.  Psychiatric/Behavioral: Negative.     Objective:  Physical Exam Constitutional:      Appearance: He is well-developed.  HENT:     Head: Normocephalic and atraumatic.  Cardiovascular:     Rate and Rhythm: Normal rate and regular rhythm.  Pulmonary:     Effort: Pulmonary effort is normal. No respiratory distress.     Breath sounds: Normal breath sounds. No wheezing or rales.  Abdominal:     General: Bowel sounds are normal. There is no distension.     Palpations: Abdomen is soft.     Tenderness: There is no abdominal tenderness. There is no rebound.  Musculoskeletal:     Cervical back: Normal range of motion.  Skin:    General: Skin is warm and dry.  Neurological:     Mental Status: He is alert and oriented to person, place, and time. Mental status is at baseline.     Coordination: Coordination normal.    Vitals:   01/26/21 0800  BP: 118/72  Pulse: 73  Resp: 18  Temp: 98.4 F (36.9 C)  TempSrc: Oral  SpO2: 98%  Weight: 139 lb 6.4 oz (63.2 kg)  Height: 5\' 4"  (1.626 m)    This visit occurred during the SARS-CoV-2 public health emergency.  Safety protocols were in place, including screening questions prior to the visit, additional usage of staff PPE, and extensive cleaning of exam room while observing appropriate contact time as indicated  for disinfecting solutions.   Assessment & Plan:

## 2021-01-26 NOTE — Patient Instructions (Signed)
Think about getting the shingles vaccine and the covid-19 booster in the fall.

## 2021-01-26 NOTE — Assessment & Plan Note (Signed)
Checking HgA1c as due. Prior 6.1.

## 2021-01-26 NOTE — Assessment & Plan Note (Signed)
Taking oral replacement and recent levels show adequate and repeat labs not needed today.

## 2021-01-26 NOTE — Assessment & Plan Note (Signed)
Recent levels normal on otc replacement and will not recheck today.

## 2021-01-26 NOTE — Assessment & Plan Note (Signed)
BP at goal and he wishes [place] to take over prescribing his amlodipine 5 mg daily and avapro 150 mg BID which we have done and sent in today. Recent CMP normal.

## 2021-03-09 ENCOUNTER — Other Ambulatory Visit (HOSPITAL_COMMUNITY): Payer: Self-pay

## 2021-03-15 ENCOUNTER — Encounter: Payer: Self-pay | Admitting: Family Medicine

## 2021-05-16 ENCOUNTER — Encounter: Payer: Self-pay | Admitting: Internal Medicine

## 2021-05-25 ENCOUNTER — Other Ambulatory Visit (HOSPITAL_COMMUNITY): Payer: Self-pay

## 2021-07-31 ENCOUNTER — Other Ambulatory Visit (HOSPITAL_COMMUNITY): Payer: Self-pay

## 2021-08-01 ENCOUNTER — Other Ambulatory Visit (HOSPITAL_COMMUNITY): Payer: Self-pay

## 2021-09-04 ENCOUNTER — Ambulatory Visit: Payer: BC Managed Care – PPO | Admitting: Internal Medicine

## 2021-09-04 ENCOUNTER — Encounter: Payer: Self-pay | Admitting: Internal Medicine

## 2021-09-04 ENCOUNTER — Other Ambulatory Visit (INDEPENDENT_AMBULATORY_CARE_PROVIDER_SITE_OTHER): Payer: BC Managed Care – PPO

## 2021-09-04 ENCOUNTER — Other Ambulatory Visit: Payer: Self-pay

## 2021-09-04 VITALS — BP 122/76 | HR 87 | Temp 99.5°F | Ht 64.0 in | Wt 138.0 lb

## 2021-09-04 DIAGNOSIS — R6889 Other general symptoms and signs: Secondary | ICD-10-CM

## 2021-09-04 DIAGNOSIS — R7303 Prediabetes: Secondary | ICD-10-CM | POA: Diagnosis not present

## 2021-09-04 DIAGNOSIS — E538 Deficiency of other specified B group vitamins: Secondary | ICD-10-CM | POA: Diagnosis not present

## 2021-09-04 DIAGNOSIS — I10 Essential (primary) hypertension: Secondary | ICD-10-CM

## 2021-09-04 DIAGNOSIS — R200 Anesthesia of skin: Secondary | ICD-10-CM

## 2021-09-04 DIAGNOSIS — E559 Vitamin D deficiency, unspecified: Secondary | ICD-10-CM

## 2021-09-04 DIAGNOSIS — R202 Paresthesia of skin: Secondary | ICD-10-CM

## 2021-09-04 LAB — VITAMIN D 25 HYDROXY (VIT D DEFICIENCY, FRACTURES): VITD: 53.06 ng/mL (ref 30.00–100.00)

## 2021-09-04 LAB — COMPREHENSIVE METABOLIC PANEL
ALT: 15 U/L (ref 0–53)
AST: 15 U/L (ref 0–37)
Albumin: 4.4 g/dL (ref 3.5–5.2)
Alkaline Phosphatase: 67 U/L (ref 39–117)
BUN: 15 mg/dL (ref 6–23)
CO2: 26 mEq/L (ref 19–32)
Calcium: 9.7 mg/dL (ref 8.4–10.5)
Chloride: 106 mEq/L (ref 96–112)
Creatinine, Ser: 1.06 mg/dL (ref 0.40–1.50)
GFR: 78.66 mL/min (ref >60.00)
Glucose, Bld: 89 mg/dL (ref 70–99)
Potassium: 4.2 mEq/L (ref 3.5–5.1)
Sodium: 140 mEq/L (ref 135–145)
Total Bilirubin: 1.2 mg/dL (ref 0.2–1.2)
Total Protein: 7 g/dL (ref 6.0–8.3)

## 2021-09-04 LAB — CBC
HCT: 42.7 % (ref 39.0–52.0)
Hemoglobin: 14.3 g/dL (ref 13.0–17.0)
MCHC: 33.5 g/dL (ref 30.0–36.0)
MCV: 83.7 fl (ref 78.0–100.0)
Platelets: 179 10*3/uL (ref 150.0–400.0)
RBC: 5.1 Mil/uL (ref 4.22–5.81)
RDW: 14.4 % (ref 11.5–15.5)
WBC: 6.3 10*3/uL (ref 4.0–10.5)

## 2021-09-04 LAB — TSH: TSH: 4.13 u[IU]/mL (ref 0.35–5.50)

## 2021-09-04 LAB — T4, FREE: Free T4: 0.84 ng/dL (ref 0.60–1.60)

## 2021-09-04 LAB — FERRITIN: Ferritin: 21.4 ng/mL — ABNORMAL LOW (ref 22.0–322.0)

## 2021-09-04 LAB — HEMOGLOBIN A1C: Hgb A1c MFr Bld: 6 % (ref 4.6–6.5)

## 2021-09-04 LAB — T3, FREE: T3, Free: 3.2 pg/mL (ref 2.3–4.2)

## 2021-09-04 LAB — VITAMIN B12: Vitamin B-12: 1152 pg/mL — ABNORMAL HIGH (ref 211–911)

## 2021-09-04 NOTE — Progress Notes (Signed)
? ?  Subjective:  ? ?Patient ID: Darius Velasquez, male    DOB: 09/18/1965, 56 y.o.   MRN: [id number] ? ?HPI ?The patient is here for concerns.  ? ?Review of Systems  ?Constitutional:  Positive for fatigue.  ?HENT: Negative.    ?Eyes: Negative.   ?Respiratory:  Negative for cough, chest tightness and shortness of breath.   ?Cardiovascular:  Negative for chest pain, palpitations and leg swelling.  ?Gastrointestinal:  Negative for abdominal distention, abdominal pain, constipation, diarrhea, nausea and vomiting.  ?Endocrine: Positive for cold intolerance and heat intolerance.  ?Musculoskeletal: Negative.   ?Skin: Negative.   ?Neurological:  Positive for tremors and numbness.  ?Psychiatric/Behavioral:  Positive for sleep disturbance.   ? ?Objective:  ?Physical Exam ?Constitutional:   ?   Appearance: He is well-developed.  ?HENT:  ?   Head: Normocephalic and atraumatic.  ?Cardiovascular:  ?   Rate and Rhythm: Normal rate and regular rhythm.  ?Pulmonary:  ?   Effort: Pulmonary effort is normal. No respiratory distress.  ?   Breath sounds: Normal breath sounds. No wheezing or rales.  ?Abdominal:  ?   General: Bowel sounds are normal. There is no distension.  ?   Palpations: Abdomen is soft.  ?   Tenderness: There is no abdominal tenderness. There is no rebound.  ?Musculoskeletal:  ?   Cervical back: Normal range of motion.  ?Skin: ?   General: Skin is warm and dry.  ?   Comments: Nail changes  ?Neurological:  ?   Mental Status: He is alert and oriented to person, place, and time.  ?   Coordination: Coordination normal.  ? ? ?Vitals:  ? 09/04/21 1451  ?BP: 122/76  ?Pulse: 87  ?Temp: 99.5 ?F (37.5 ?C)  ?TempSrc: Oral  ?SpO2: 93%  ?Weight: 138 lb (62.6 kg)  ?Height: 5\' 4"  (1.626 m)  ? ? ?This visit occurred during the SARS-CoV-2 public health emergency.  Safety protocols were in place, including screening questions prior to the visit, additional usage of staff PPE, and extensive cleaning of exam room while observing appropriate  contact time as indicated for disinfecting solutions.  ? ?Assessment & Plan:  ? ?

## 2021-09-04 NOTE — Patient Instructions (Addendum)
We will check the thyroid and vitamin and hormone levels. If these are normal we can check the cortisol and testosterone have to be checked we can check those before 9am. ? ? ?

## 2021-09-05 ENCOUNTER — Encounter: Payer: Self-pay | Admitting: Internal Medicine

## 2021-09-05 ENCOUNTER — Other Ambulatory Visit (INDEPENDENT_AMBULATORY_CARE_PROVIDER_SITE_OTHER): Payer: BC Managed Care – PPO

## 2021-09-05 DIAGNOSIS — R6889 Other general symptoms and signs: Secondary | ICD-10-CM

## 2021-09-05 DIAGNOSIS — I10 Essential (primary) hypertension: Secondary | ICD-10-CM

## 2021-09-05 LAB — CORTISOL: Cortisol, Plasma: 12.6 ug/dL

## 2021-09-05 LAB — LIPID PANEL
Cholesterol: 121 mg/dL (ref 0–200)
HDL: 51.3 mg/dL (ref >39.00)
LDL Cholesterol: 49 mg/dL (ref 0–99)
NonHDL: 70.09
Total CHOL/HDL Ratio: 2
Triglycerides: 106 mg/dL (ref 0.0–149.0)
VLDL: 21.2 mg/dL (ref 0.0–40.0)

## 2021-09-05 NOTE — Assessment & Plan Note (Signed)
Checking CBC and CMP today. Checking testosterone and cortisol another day before 9am and TSH and free T3 and free T4 levels today. Rechecking vitamin D and B12 levels as well as any one of these could be contributing.  ?

## 2021-09-05 NOTE — Assessment & Plan Note (Signed)
HgA1c due and ordered today. He is using dietary changes to help avoid progression to diabetes. Adjust as needed.  ?

## 2021-09-05 NOTE — Assessment & Plan Note (Signed)
With new numbness and fatigue he needs recheck of vitamin D level. He is taking otc vitamin D only at this time. ?

## 2021-09-05 NOTE — Assessment & Plan Note (Signed)
With new numbness and fatigue needs recheck of vitamin B12 level. He is still taking otc supplement. Adjust as needed.  ?

## 2021-09-05 NOTE — Assessment & Plan Note (Signed)
BP is at goal on irbesartan 150 mg BID and needs check of CMP and CBC based on new symptoms of fatigue. Adjust as needed.  ?

## 2021-09-05 NOTE — Assessment & Plan Note (Signed)
This did improve last year and is worsening again. It is unclear if cold is a trigger or if there is another trigger. Checking cortisol and testosterone levels due to the concurrent fatigue and cold/heat intolerance.  ?

## 2021-09-06 ENCOUNTER — Encounter: Payer: Self-pay | Admitting: Family Medicine

## 2021-09-06 ENCOUNTER — Encounter: Payer: Self-pay | Admitting: Internal Medicine

## 2021-09-06 LAB — TESTOSTERONE TOTAL,FREE,BIO, MALES
Albumin: 4.5 g/dL (ref 3.6–5.1)
Sex Hormone Binding: 22 nmol/L (ref 22–77)
Testosterone, Bioavailable: 123.8 ng/dL (ref 110.0–575.0)
Testosterone, Free: 60.2 pg/mL (ref 46.0–224.0)
Testosterone: 343 ng/dL (ref 250–827)

## 2021-09-07 NOTE — Progress Notes (Signed)
I, Christoper FabianMolly Weber, LAT, ATC, am serving as scribe for Dr. Clementeen GrahamEvan Tamirah George.  Darius Velasquez is a 56 y.o. male who presents to [facility] today for insomnia and polyarthalgia.  He was last seen by Dr. Denyse Amassorey on 10/30/20 c/o paresthesias in his B fingers and toes and both upper and lower back pain.  Since then, he has been noticing a constellation of different symptoms and is here today to discuss cold sensitivity and numbness, insomnia, weight loss and radiating pain/heat/sweat particularly at night.  He has seen his PCP a few times since his last visit w/ Dr. Denyse Amassorey regarding these issues his most recent visit being on 09/04/21. 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  Pt c/o pain in his neck and into the base of his skull. Pt reports R arm pain, sensitivity to cold, paresthesia in bilat hands. Pt notes no relief from the gabapentin and is really struggling to sleep.  Diagnostic testing: Labwork- 09/04/21 and 09/05/21; C-spine XR- 10/23/20  Pertinent review of systems: Positive for chills weight loss cold intolerance numbness tingling cough.  Additionally positive for insomnia.  Relevant historical information: Hypertension.  Prediabetes.  B12 deficiency corrected.   Exam:  BP 106/74    Pulse 89    Ht 5\' 4"  (1.626 m)    Wt 138 lb (62.6 kg)    SpO2 98%    BMI 23.69 kg/m  General: Well Developed, well nourished, and in no acute distress.   MSK:  C-spine: Normal cervical motion. Upper extremity strength is intact. Slight tremor right upper extremity.    Lab and Radiology Results  Chest x-ray and cervical spine obtained today personally independently interpreted.  Chest x-ray: No obvious infiltrates.  Acute intrathoracic abnormality.  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  No acute fractures.   Await formal radiology review  Recent Results (from the past 2160 hour(s))  VITAMIN D 25 Hydroxy (Vit-D Deficiency, Fractures)     Status: None   Collection  Time: 09/04/21  3:55 PM  Result Value Ref Range   VITD 53.06 30.00 - 100.00 ng/mL  Vitamin B12     Status: Abnormal   Collection Time: 09/04/21  3:55 PM  Result Value Ref Range   Vitamin B-12 1,152 (H) 211 - 911 pg/mL  TSH     Status: None   Collection Time: 09/04/21  3:55 PM  Result Value Ref Range   TSH 4.13 0.35 - 5.50 uIU/mL  T4, free     Status: None   Collection Time: 09/04/21  3:55 PM  Result Value Ref Range   Free T4 0.84 0.60 - 1.60 ng/dL    Comment: Specimens from patients who are undergoing biotin therapy and /or ingesting biotin supplements may contain high levels of biotin.  The higher biotin concentration in these specimens interferes with this Free T4 assay.  Specimens that contain high levels  of biotin may cause false high results for this Free T4 assay.  Please interpret results in light of the total clinical presentation of the patient.    T3, free     Status: None   Collection Time: 09/04/21  3:55 PM  Result Value Ref Range   T3, Free 3.2 2.3 - 4.2 pg/mL  Hemoglobin A1c     Status: None   Collection Time: 09/04/21  3:55 PM  Result Value Ref Range   Hgb A1c MFr Bld 6.0 4.6 - 6.5 %    Comment: Glycemic  Control Guidelines for People with Diabetes:Non Diabetic:  <6%Goal of Therapy: <7%Additional Action Suggested:  >8%   Ferritin     Status: Abnormal   Collection Time: 09/04/21  3:55 PM  Result Value Ref Range   Ferritin 21.4 (L) 22.0 - 322.0 ng/mL  Comprehensive metabolic panel     Status: None   Collection Time: 09/04/21  3:55 PM  Result Value Ref Range   Sodium 140 135 - 145 mEq/L   Potassium 4.2 3.5 - 5.1 mEq/L   Chloride 106 96 - 112 mEq/L   CO2 26 19 - 32 mEq/L   Glucose, Bld 89 70 - 99 mg/dL   BUN 15 6 - 23 mg/dL   Creatinine, Ser 1.61 0.40 - 1.50 mg/dL   Total Bilirubin 1.2 0.2 - 1.2 mg/dL   Alkaline Phosphatase 67 39 - 117 U/L   AST 15 0 - 37 U/L   ALT 15 0 - 53 U/L   Total Protein 7.0 6.0 - 8.3 g/dL   Albumin 4.4 3.5 - 5.2 g/dL   GFR 09.60  >45.40 mL/min    Comment: Calculated using the CKD-EPI Creatinine Equation (2021)   Calcium 9.7 8.4 - 10.5 mg/dL  CBC     Status: None   Collection Time: 09/04/21  3:55 PM  Result Value Ref Range   WBC 6.3 4.0 - 10.5 K/uL   RBC 5.10 4.22 - 5.81 Mil/uL   Platelets 179.0 150.0 - 400.0 K/uL   Hemoglobin 14.3 13.0 - 17.0 g/dL   HCT 98.1 19.1 - 47.8 %   MCV 83.7 78.0 - 100.0 fl   MCHC 33.5 30.0 - 36.0 g/dL   RDW 29.5 62.1 - 30.8 %  Testosterone Total,Free,Bio, Males-([facility])     Status: None   Collection Time: 09/05/21  8:58 AM  Result Value Ref Range   Testosterone 343 250 - 827 ng/dL   Albumin 4.5 3.6 - 5.1 g/dL   Sex Hormone Binding 22 22 - 77 nmol/L   Testosterone, Free 60.2 46.0 - 224.0 pg/mL   Testosterone, Bioavailable 123.8 110.0 - 575.0 ng/dL  Cortisol     Status: None   Collection Time: 09/05/21  8:58 AM  Result Value Ref Range   Cortisol, Plasma 12.6 ug/dL    Comment: AM:  4.3 - 22.4 ug/dLPM:  3.1 - 16.7 ug/dL  Lipid panel     Status: None   Collection Time: 09/05/21  8:58 AM  Result Value Ref Range   Cholesterol 121 0 - 200 mg/dL    Comment: ATP III Classification       Desirable:  < 200 mg/dL               Borderline High:  200 - 239 mg/dL          High:  > = 657 mg/dL   Triglycerides 846.9 0.0 - 149.0 mg/dL    Comment: Normal:  <629 mg/dLBorderline High:  150 - 199 mg/dL   HDL 52.84 >13.24 mg/dL   VLDL 40.1 0.0 - 02.7 mg/dL   LDL Cholesterol 49 0 - 99 mg/dL   Total CHOL/HDL Ratio 2     Comment:                Men          Women1/2 Average Risk     3.4          3.3Average Risk          5.0  4.42X Average Risk          9.6          7.13X Average Risk          15.0          11.0                       NonHDL 70.09     Comment: NOTE:  Non-HDL goal should be 30 mg/dL higher than patient's LDL goal (i.e. LDL goal of < 70 mg/dL, would have non-HDL goal of < 100 mg/dL)      Assessment and Plan: 56 y.o. male with multiple complaints however he does have persistent  neck pain and bilateral upper extremity paresthesias and radiating pain.  Certainly his neck pain and bilateral upper extremity paresthesias and pain could be cervical radiculopathy or a more distal peripheral nerve issue.  Plan for MRI C-spine as he has failed conservative management for this and to help understand source of pain and to determine future treatment options.  Additionally we will proceed with a nerve conduction study.  Additionally he has weight loss and chills and night sweats and insomnia.  We will obtain a chest x-ray.  His initial work-up with PCP obtained last week did not show any obvious concern for malignancy but further work-up is reasonable.  Await radiology overread.  He also has significant difficulty with insomnia.  I think this is probably related to his neck pain but it may be simple insomnia which may be contributing to his overall feeling of unwellness.  Plan for trial of trazodone.  I expect I will be seeing him back pretty soon after his MRI to talk about results treatment plan and options.   He wondered about sed rate and rheumatologic work-up.  I think this is reasonable.  Anticipate future labs.   PDMP not reviewed this encounter. Orders Placed This Encounter  Procedures   DG Cervical Spine 2 or 3 views    Standing Status:   Future    Number of Occurrences:   1    Standing Expiration Date:   09/11/2022    Order Specific Question:   Reason for Exam (SYMPTOM  OR DIAGNOSIS REQUIRED)    Answer:   neck pain    Order Specific Question:   Preferred imaging location?    Answer:   [facility]   MR CERVICAL SPINE WO CONTRAST    Standing Status:   Future    Standing Expiration Date:   10/11/2021    Order Specific Question:   What is the patient's sedation requirement?    Answer:   No Sedation    Order Specific Question:   Does the patient have a pacemaker or implanted devices?    Answer:   No    Order Specific Question:   Preferred imaging location?     Answer:   [facility] (table limit-350lbs)   DG Chest 2 View    Standing Status:   Future    Number of Occurrences:   1    Standing Expiration Date:   10/11/2021    Order Specific Question:   Reason for Exam (SYMPTOM  OR DIAGNOSIS REQUIRED)    Answer:   chronic cough    Order Specific Question:   Preferred imaging location?    Answer:   [facility]   Ambulatory referral to Neurology    Referral Priority:   Routine    Referral Type:  Consultation    Referral Reason:   Specialty Services Required    Requested Specialty:   Neurology    Number of Visits Requested:   1   Meds ordered this encounter  Medications   traZODone (DESYREL) 50 MG tablet    Sig: Take 1 tablet (50 mg total) by mouth at bedtime.    Dispense:  30 tablet    Refill:  0     Discussed warning signs or symptoms. Please see discharge instructions. Patient expresses understanding.   The above documentation has been reviewed and is accurate and complete Clementeen Graham, M.D.  Total encounter time 30 minutes including face-to-face time with the patient and, reviewing past medical record, and charting on the date of service.

## 2021-09-10 ENCOUNTER — Other Ambulatory Visit: Payer: Self-pay

## 2021-09-10 ENCOUNTER — Other Ambulatory Visit (HOSPITAL_COMMUNITY): Payer: Self-pay

## 2021-09-10 ENCOUNTER — Ambulatory Visit: Payer: BC Managed Care – PPO | Admitting: Family Medicine

## 2021-09-10 ENCOUNTER — Ambulatory Visit (INDEPENDENT_AMBULATORY_CARE_PROVIDER_SITE_OTHER): Payer: BC Managed Care – PPO

## 2021-09-10 ENCOUNTER — Encounter: Payer: Self-pay | Admitting: Family Medicine

## 2021-09-10 VITALS — BP 106/74 | HR 89 | Ht 64.0 in | Wt 138.0 lb

## 2021-09-10 DIAGNOSIS — M5412 Radiculopathy, cervical region: Secondary | ICD-10-CM

## 2021-09-10 DIAGNOSIS — G4709 Other insomnia: Secondary | ICD-10-CM | POA: Diagnosis not present

## 2021-09-10 DIAGNOSIS — R053 Chronic cough: Secondary | ICD-10-CM | POA: Diagnosis not present

## 2021-09-10 DIAGNOSIS — M791 Myalgia, unspecified site: Secondary | ICD-10-CM

## 2021-09-10 DIAGNOSIS — R202 Paresthesia of skin: Secondary | ICD-10-CM

## 2021-09-10 DIAGNOSIS — R61 Generalized hyperhidrosis: Secondary | ICD-10-CM

## 2021-09-10 DIAGNOSIS — R251 Tremor, unspecified: Secondary | ICD-10-CM

## 2021-09-10 DIAGNOSIS — R634 Abnormal weight loss: Secondary | ICD-10-CM

## 2021-09-10 DIAGNOSIS — R5383 Other fatigue: Secondary | ICD-10-CM

## 2021-09-10 MED ORDER — TRAZODONE HCL 50 MG PO TABS
50.0000 mg | ORAL_TABLET | Freq: Every day | ORAL | 0 refills | Status: AC
  Filled 2021-09-10: qty 30, 30d supply, fill #0

## 2021-09-10 NOTE — Patient Instructions (Addendum)
Thank you for coming in today.  ? ?Please get an Xray today before you leave  ? ?You should hear from MRI scheduling within 1 week. If you do not hear please let me know.   ? ?I've ordered a nerve conduction study. You should hear about scheduling soon. ? ?I've sent a prescription for trazadone to your pharmacy.  ? ?Recheck back after MRI ?

## 2021-09-11 NOTE — Progress Notes (Signed)
Chest x-ray looks reassuringly normal.

## 2021-09-11 NOTE — Progress Notes (Signed)
X-ray of her cervical spine shows arthritis changes.  We will see better on an MRI potential pinched nerves.

## 2021-09-15 ENCOUNTER — Ambulatory Visit (INDEPENDENT_AMBULATORY_CARE_PROVIDER_SITE_OTHER): Payer: BC Managed Care – PPO

## 2021-09-15 ENCOUNTER — Other Ambulatory Visit: Payer: Self-pay

## 2021-09-15 DIAGNOSIS — M79601 Pain in right arm: Secondary | ICD-10-CM

## 2021-09-15 DIAGNOSIS — M542 Cervicalgia: Secondary | ICD-10-CM | POA: Diagnosis not present

## 2021-09-15 DIAGNOSIS — M5412 Radiculopathy, cervical region: Secondary | ICD-10-CM | POA: Diagnosis not present

## 2021-09-15 DIAGNOSIS — M79641 Pain in right hand: Secondary | ICD-10-CM

## 2021-09-15 DIAGNOSIS — R202 Paresthesia of skin: Secondary | ICD-10-CM | POA: Diagnosis not present

## 2021-09-18 ENCOUNTER — Encounter: Payer: Self-pay | Admitting: Family Medicine

## 2021-09-18 NOTE — Progress Notes (Signed)
MRI shows some areas of potential pinched nerves in your cervical spine that could cause numbness tingling pain or weakness especially in your arms or hands. ? ?Have you scheduled your nerve conduction study yet? ? ?Would you like to return to clinic to go over the results of the MRI in full detail and look at the pictures together and talk about what we can do next as well as get those labs ordered? ? ?If so please schedule an appointment with me.  I could try to order a neck injection which could potentially help if you like me to just do that. ? ?Ellard Artis

## 2021-09-19 ENCOUNTER — Encounter: Payer: Self-pay | Admitting: Neurology

## 2021-09-19 ENCOUNTER — Other Ambulatory Visit: Payer: Self-pay

## 2021-09-19 DIAGNOSIS — R202 Paresthesia of skin: Secondary | ICD-10-CM

## 2021-09-21 ENCOUNTER — Ambulatory Visit: Payer: BC Managed Care – PPO | Admitting: Family Medicine

## 2021-09-21 ENCOUNTER — Encounter: Payer: Self-pay | Admitting: Neurology

## 2021-09-21 ENCOUNTER — Other Ambulatory Visit: Payer: Self-pay

## 2021-09-21 VITALS — BP 120/78 | HR 78 | Ht 64.0 in | Wt 140.4 lb

## 2021-09-21 DIAGNOSIS — M5412 Radiculopathy, cervical region: Secondary | ICD-10-CM

## 2021-09-21 DIAGNOSIS — R202 Paresthesia of skin: Secondary | ICD-10-CM

## 2021-09-21 NOTE — Patient Instructions (Addendum)
Thank you for coming in today.  ? ?Please call [facility] at [phone] to schedule your spine injection.   ? ?Proceed to the nerve conduction study in April.  ? ?Recheck after the results are back from nerve conduction study.  ?

## 2021-09-21 NOTE — Progress Notes (Signed)
? ?I, Philbert Riser, LAT, ATC acting as a scribe for Clementeen Graham, MD. ? ?Darius Velasquez is a 56 y.o. male who presents to [facility] today for f/u cervical radiculitis w/ UE paresthesias, polyarthralgia, and multiple other complains. 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 Pt was last seen by Dr. Denyse Amass on 09/10/21 and was advised to proceed to c-spine MRI. Today, pt reports the pain in his neck is about the same and still disturbing his sleep. Pt continues to have radiating pain in to both UE. Pt notes that when he is working at a computer, especially holding his mouse, his symptoms will worsen. ? ?Dx testing: 09/15/21 C-spine MRI ?09/10/21 C-spine & chest XR ?3/7 & 09/05/21 Labs ?10/23/20 C-spine XR ? ?Pertinent review of systems: Positive for chills and night sweats.  Positive for weight loss. ? ?Relevant historical information: Hypertension ? ? ?Exam:  ?BP 120/78   Pulse 78   Ht 5\' 4"  (1.626 m)   Wt 140 lb 6.4 oz (63.7 kg)   SpO2 98%   BMI 24.10 kg/m?  ?General: Well Developed, well nourished, and in no acute distress.  ? ?MSK: Upper extremity strength is intact. ? ? ? ?Lab and Radiology Results ?  ?EXAM: ?MRI CERVICAL SPINE WITHOUT CONTRAST ?  ?TECHNIQUE: ?Multiplanar, multisequence MR imaging of the cervical spine was ?performed. No intravenous contrast was administered. ?  ?COMPARISON:  Radiography 09/10/2021 ?  ?FINDINGS: ?Alignment: Straightening of the normal cervical lordosis. ?  ?Vertebrae: No fracture or focal bone lesion. ?  ?Cord: No cord compression or focal cord lesion. ?  ?Posterior Fossa, vertebral arteries, paraspinal tissues: Negative ?  ?Disc levels: ?  ?Foramen magnum and C1-2 are normal. ?  ?C2-3: Small central disc bulge.  No compressive stenosis. ?  ?C3-4: Small central disc bulge.  No compressive stenosis. ?  ?C4-5: Small central disc bulge.  No compressive stenosis. ?  ?C5-6: Spondylosis with endplate osteophytes and bulging of the  disc. ?Narrowing of the ventral subarachnoid space but no compression of ?the cord. AP diameter of the canal in the midline 8.3 mm. Bilateral ?foraminal narrowing that could affect either C6 nerve. ?  ?C6-7: Spondylosis with endplate osteophytes and bulging of the disc ?more prominent towards the right. Narrowing of the ventral ?subarachnoid space but no compression of the cord. AP diameter of ?the canal in the midline 8.3 mm. Bilateral foraminal narrowing, ?right worse than left. Either C7 nerve could be affected, ?particularly the right. ?  ?C7-T1: Normal interspace. ?  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 Narrowing of the canal with AP ?diameter of 8.3 mm. No cord compression at either level. At C5-6 ?there is bilateral foraminal narrowing that could affect either C6 ?nerve. At C6-7, there is bilateral foraminal narrowing, more severe ?on the right, which could affect either C7 nerve, especially the ?right. ?  ?  ?Electronically Signed ?  By: 09/12/2021 M.D. ?  On: 09/17/2021 15:36 ?  ?I, 09/19/2021, personally (independently) visualized and performed the interpretation of the images attached in this note. ? ? ? ? ? ?Assessment and Plan: ?56 y.o. male with bilateral upper extremity paresthesias right worse than left.  Paresthesias primarily affects the thumb index finger and middle finger of the right hand. ?This is associated with other more systemic symptoms that are not addressed during today's visit. ? ?We spent quite a bit of time today discussing the results of his MRI and  his upcoming nerve conduction study and the pros and cons of an epidural steroid injection and potential surgery. ? ?Plan for an epidural steroid injection as ordered and nerve conduction study as scheduled on April 18.  Plan to recheck after the results of the nerve conduction study have returned anticipated about a month from now. ? ?Total encounter time 40 minutes including face-to-face time with the patient and, reviewing  past medical record, and charting on the date of service.   ?Extensive question and answers as well as reviewed the results of the MRI imaging and report. ? ? ?PDMP not reviewed this encounter. ?Orders Placed This Encounter  ?Procedures  ? DG INJECT DIAG/THERA/INC NEEDLE/CATH/PLC EPI/LUMB/SAC W/IMG  ?  Standing Status:   Future  ?  Standing Expiration Date:   09/22/2022  ?  Order Specific Question:   Reason for Exam (SYMPTOM  OR DIAGNOSIS REQUIRED)  ?  Answer:   ESI rt arm C6 C7 level anf technique per radiology  ?  Order Specific Question:   Preferred Imaging Location?  ?  Answer:   [id number]  ?  Order Specific Question:   Radiology Contrast Protocol - do NOT remove file path  ?  Answer:   [email or web address]  ? ?No orders of the defined types were placed in this encounter. ? ? ? ?Discussed warning signs or symptoms. Please see discharge instructions. Patient expresses understanding. ? ? ?The above documentation has been reviewed and is accurate and complete Clementeen Graham, M.D. ? ? ?

## 2021-09-23 ENCOUNTER — Encounter: Payer: Self-pay | Admitting: Family Medicine

## 2021-09-26 ENCOUNTER — Other Ambulatory Visit: Payer: Self-pay | Admitting: Family Medicine

## 2021-09-26 ENCOUNTER — Ambulatory Visit
Admission: RE | Admit: 2021-09-26 | Discharge: 2021-09-26 | Disposition: A | Discharge status: Home / Self Care | Payer: BC Managed Care – PPO | Source: Ambulatory Visit | Attending: Family Medicine | Admitting: Family Medicine

## 2021-09-26 DIAGNOSIS — M5412 Radiculopathy, cervical region: Secondary | ICD-10-CM

## 2021-09-26 MED ORDER — IOPAMIDOL (ISOVUE-M 300) INJECTION 61%
1.0000 mL | Freq: Once | INTRAMUSCULAR | Status: AC | PRN
  Administered 2021-09-26: 1 mL via EPIDURAL

## 2021-09-26 MED ORDER — TRIAMCINOLONE ACETONIDE 40 MG/ML IJ SUSP (RADIOLOGY)
60.0000 mg | Freq: Once | INTRAMUSCULAR | Status: AC
Start: 2021-09-26 — End: 2021-09-26
  Administered 2021-09-26: 60 mg via EPIDURAL

## 2021-09-26 NOTE — Discharge Instructions (Signed)

## 2021-10-12 ENCOUNTER — Other Ambulatory Visit (HOSPITAL_COMMUNITY): Payer: Self-pay

## 2021-10-16 ENCOUNTER — Encounter: Payer: BC Managed Care – PPO | Admitting: Neurology

## 2021-10-26 ENCOUNTER — Ambulatory Visit: Payer: BC Managed Care – PPO | Admitting: Family Medicine

## 2021-11-20 ENCOUNTER — Other Ambulatory Visit (HOSPITAL_COMMUNITY): Payer: Self-pay

## 2021-11-22 ENCOUNTER — Other Ambulatory Visit (HOSPITAL_COMMUNITY): Payer: Self-pay

## 2022-01-28 ENCOUNTER — Other Ambulatory Visit (HOSPITAL_COMMUNITY): Payer: Self-pay

## 2022-01-28 MED ORDER — IRBESARTAN 300 MG PO TABS
300.0000 mg | ORAL_TABLET | Freq: Every day | ORAL | 2 refills | Status: DC
  Filled 2022-01-28 – 2022-04-02 (×2): qty 90, 90d supply, fill #0
  Filled 2022-07-10: qty 90, 90d supply, fill #1
  Filled 2022-10-08: qty 90, 90d supply, fill #2

## 2022-01-28 MED ORDER — GABAPENTIN 300 MG PO CAPS
ORAL_CAPSULE | ORAL | 2 refills | Status: AC
  Filled 2022-01-28: qty 180, 60d supply, fill #0
  Filled 2022-04-06: qty 180, 60d supply, fill #1
  Filled 2022-10-08: qty 180, 60d supply, fill #2

## 2022-01-28 MED ORDER — ATORVASTATIN CALCIUM 20 MG PO TABS
ORAL_TABLET | ORAL | 2 refills | Status: AC
  Filled 2022-01-28: qty 90, 90d supply, fill #0
  Filled 2022-04-02 – 2022-04-06 (×2): qty 90, 90d supply, fill #1
  Filled 2022-10-08: qty 90, 90d supply, fill #2

## 2022-01-28 MED ORDER — AMLODIPINE BESYLATE 5 MG PO TABS
5.0000 mg | ORAL_TABLET | Freq: Every day | ORAL | 2 refills | Status: DC
  Filled 2022-01-28 – 2022-04-02 (×2): qty 90, 90d supply, fill #0
  Filled 2022-07-10: qty 90, 90d supply, fill #1
  Filled 2022-10-08: qty 90, 90d supply, fill #2

## 2022-01-29 ENCOUNTER — Other Ambulatory Visit (HOSPITAL_COMMUNITY): Payer: Self-pay

## 2022-02-06 ENCOUNTER — Other Ambulatory Visit: Payer: Self-pay | Admitting: Family Medicine

## 2022-02-06 ENCOUNTER — Ambulatory Visit
Admission: RE | Admit: 2022-02-06 | Discharge: 2022-02-06 | Disposition: A | Discharge status: Home / Self Care | Payer: BC Managed Care – PPO | Source: Ambulatory Visit | Attending: Family Medicine | Admitting: Family Medicine

## 2022-02-06 DIAGNOSIS — M545 Low back pain, unspecified: Secondary | ICD-10-CM

## 2022-02-18 ENCOUNTER — Other Ambulatory Visit (HOSPITAL_COMMUNITY): Payer: Self-pay

## 2022-04-02 ENCOUNTER — Other Ambulatory Visit (HOSPITAL_COMMUNITY): Payer: Self-pay

## 2022-04-06 ENCOUNTER — Other Ambulatory Visit (HOSPITAL_COMMUNITY): Payer: Self-pay

## 2022-07-10 ENCOUNTER — Encounter (HOSPITAL_COMMUNITY): Payer: Self-pay

## 2022-07-10 ENCOUNTER — Other Ambulatory Visit (HOSPITAL_COMMUNITY): Payer: Self-pay

## 2022-10-08 ENCOUNTER — Other Ambulatory Visit (HOSPITAL_COMMUNITY): Payer: Self-pay

## 2023-02-03 ENCOUNTER — Other Ambulatory Visit (HOSPITAL_COMMUNITY): Payer: Self-pay

## 2023-02-03 MED ORDER — AMLODIPINE BESYLATE 5 MG PO TABS
5.0000 mg | ORAL_TABLET | Freq: Every day | ORAL | 1 refills | Status: AC
  Filled 2023-02-03: qty 90, 90d supply, fill #0

## 2023-02-04 ENCOUNTER — Other Ambulatory Visit (HOSPITAL_COMMUNITY): Payer: Self-pay

## 2023-02-04 MED ORDER — AMLODIPINE BESYLATE 5 MG PO TABS
5.0000 mg | ORAL_TABLET | Freq: Every day | ORAL | 3 refills | Status: DC
  Filled 2023-02-04 – 2023-04-29 (×2): qty 90, 90d supply, fill #0
  Filled 2023-07-28: qty 90, 90d supply, fill #1
  Filled 2023-11-03: qty 90, 90d supply, fill #2

## 2023-02-18 ENCOUNTER — Other Ambulatory Visit (HOSPITAL_COMMUNITY): Payer: Self-pay

## 2023-02-18 MED ORDER — GABAPENTIN 300 MG PO CAPS
300.0000 mg | ORAL_CAPSULE | Freq: Three times a day (TID) | ORAL | 0 refills | Status: AC | PRN
  Filled 2023-02-18 – 2023-02-19 (×2): qty 270, 90d supply, fill #0

## 2023-02-18 MED ORDER — IRBESARTAN 300 MG PO TABS
300.0000 mg | ORAL_TABLET | Freq: Every day | ORAL | 2 refills | Status: DC
  Filled 2023-02-18 – 2023-02-19 (×2): qty 90, 90d supply, fill #0
  Filled 2023-05-23: qty 90, 90d supply, fill #2
  Filled 2023-05-23: qty 90, 90d supply, fill #1
  Filled 2023-08-18: qty 90, 90d supply, fill #2

## 2023-02-18 MED ORDER — ATORVASTATIN CALCIUM 20 MG PO TABS
20.0000 mg | ORAL_TABLET | Freq: Every day | ORAL | 2 refills | Status: DC
  Filled 2023-02-18 – 2023-02-19 (×2): qty 90, 90d supply, fill #0
  Filled 2023-04-29: qty 90, 90d supply, fill #1
  Filled 2023-08-18: qty 90, 90d supply, fill #2

## 2023-02-19 ENCOUNTER — Other Ambulatory Visit (HOSPITAL_COMMUNITY): Payer: Self-pay

## 2023-02-19 ENCOUNTER — Other Ambulatory Visit: Payer: Self-pay

## 2023-04-29 ENCOUNTER — Other Ambulatory Visit (HOSPITAL_COMMUNITY): Payer: Self-pay

## 2023-05-23 ENCOUNTER — Other Ambulatory Visit (HOSPITAL_COMMUNITY): Payer: Self-pay

## 2023-07-28 ENCOUNTER — Other Ambulatory Visit (HOSPITAL_COMMUNITY): Payer: Self-pay

## 2023-08-18 ENCOUNTER — Other Ambulatory Visit (HOSPITAL_COMMUNITY): Payer: Self-pay

## 2023-11-03 ENCOUNTER — Other Ambulatory Visit (HOSPITAL_COMMUNITY): Payer: Self-pay

## 2023-11-03 MED ORDER — GABAPENTIN 300 MG PO CAPS
300.0000 mg | ORAL_CAPSULE | Freq: Three times a day (TID) | ORAL | 0 refills | Status: AC
  Filled 2023-11-03: qty 270, 90d supply, fill #0

## 2023-11-03 MED ORDER — IRBESARTAN 300 MG PO TABS
300.0000 mg | ORAL_TABLET | Freq: Every day | ORAL | 0 refills | Status: DC
  Filled 2023-11-03: qty 90, 90d supply, fill #0

## 2023-11-03 MED ORDER — ATORVASTATIN CALCIUM 20 MG PO TABS
20.0000 mg | ORAL_TABLET | Freq: Every day | ORAL | 0 refills | Status: DC
  Filled 2023-11-03: qty 90, 90d supply, fill #0

## 2023-11-05 ENCOUNTER — Other Ambulatory Visit (HOSPITAL_COMMUNITY): Payer: Self-pay

## 2023-11-10 ENCOUNTER — Other Ambulatory Visit (HOSPITAL_COMMUNITY): Payer: Self-pay

## 2024-02-13 ENCOUNTER — Other Ambulatory Visit (HOSPITAL_COMMUNITY): Payer: Self-pay

## 2024-02-13 MED ORDER — AMLODIPINE BESYLATE 5 MG PO TABS
5.0000 mg | ORAL_TABLET | Freq: Every day | ORAL | 0 refills | Status: DC
  Filled 2024-02-13: qty 90, 90d supply, fill #0

## 2024-03-08 ENCOUNTER — Other Ambulatory Visit (HOSPITAL_COMMUNITY): Payer: Self-pay

## 2024-03-08 MED ORDER — IRBESARTAN 300 MG PO TABS
300.0000 mg | ORAL_TABLET | Freq: Every day | ORAL | 0 refills | Status: AC
  Filled 2024-03-08: qty 90, 90d supply, fill #0

## 2024-06-03 ENCOUNTER — Other Ambulatory Visit (HOSPITAL_COMMUNITY): Payer: Self-pay

## 2024-06-03 MED ORDER — AMLODIPINE BESYLATE 5 MG PO TABS
5.0000 mg | ORAL_TABLET | Freq: Every day | ORAL | 2 refills | Status: AC
  Filled 2024-06-03 (×2): qty 90, 90d supply, fill #0

## 2024-06-03 MED ORDER — ATORVASTATIN CALCIUM 20 MG PO TABS
20.0000 mg | ORAL_TABLET | Freq: Every day | ORAL | 2 refills | Status: AC
  Filled 2024-06-03: qty 90, 90d supply, fill #0

## 2024-07-01 ENCOUNTER — Other Ambulatory Visit (HOSPITAL_COMMUNITY): Payer: Self-pay

## 2024-07-02 ENCOUNTER — Other Ambulatory Visit (HOSPITAL_COMMUNITY): Payer: Self-pay

## 2024-07-02 MED ORDER — IRBESARTAN 300 MG PO TABS
300.0000 mg | ORAL_TABLET | Freq: Every day | ORAL | 3 refills | Status: AC
  Filled 2024-07-02: qty 90, 90d supply, fill #0
